# Patient Record
Sex: Male | Born: 1935 | Race: White | Hispanic: No | Marital: Married | Smoking: Former smoker
Health system: Southern US, Community
[De-identification: ages and names within clinical notes are randomized; demographics above are authoritative.]

## PROBLEM LIST (undated history)

## (undated) DIAGNOSIS — E1142 Type 2 diabetes mellitus with diabetic polyneuropathy: Secondary | ICD-10-CM

## (undated) DIAGNOSIS — M1990 Unspecified osteoarthritis, unspecified site: Secondary | ICD-10-CM

## (undated) DIAGNOSIS — C61 Malignant neoplasm of prostate: Secondary | ICD-10-CM

## (undated) DIAGNOSIS — B351 Tinea unguium: Secondary | ICD-10-CM

## (undated) DIAGNOSIS — M2040 Other hammer toe(s) (acquired), unspecified foot: Secondary | ICD-10-CM

## (undated) DIAGNOSIS — I999 Unspecified disorder of circulatory system: Secondary | ICD-10-CM

## (undated) DIAGNOSIS — E119 Type 2 diabetes mellitus without complications: Secondary | ICD-10-CM

## (undated) HISTORY — DX: Type 2 diabetes mellitus with diabetic polyneuropathy: E11.42

## (undated) HISTORY — PX: PROSTATE SURGERY: SHX751

## (undated) HISTORY — DX: Type 2 diabetes mellitus without complications: E11.9

## (undated) HISTORY — PX: OTHER SURGICAL HISTORY: SHX169

## (undated) HISTORY — PX: EYE SURGERY: SHX253

## (undated) HISTORY — DX: Malignant neoplasm of prostate: C61

## (undated) HISTORY — DX: Tinea unguium: B35.1

## (undated) HISTORY — DX: Other hammer toe(s) (acquired), unspecified foot: M20.40

## (undated) HISTORY — DX: Unspecified disorder of circulatory system: I99.9

## (undated) HISTORY — DX: Unspecified osteoarthritis, unspecified site: M19.90

---

## 2005-04-01 ENCOUNTER — Other Ambulatory Visit: Payer: Self-pay

## 2005-04-01 ENCOUNTER — Ambulatory Visit: Payer: Self-pay | Admitting: Psychiatry

## 2006-03-18 ENCOUNTER — Ambulatory Visit: Payer: Self-pay | Admitting: Unknown Physician Specialty

## 2006-10-19 ENCOUNTER — Observation Stay: Payer: Self-pay | Admitting: Gastroenterology

## 2006-12-01 ENCOUNTER — Ambulatory Visit: Payer: Self-pay | Admitting: Gastroenterology

## 2007-07-17 ENCOUNTER — Emergency Department: Payer: Self-pay | Admitting: Emergency Medicine

## 2007-07-18 ENCOUNTER — Emergency Department: Payer: Self-pay | Admitting: Unknown Physician Specialty

## 2007-07-21 ENCOUNTER — Ambulatory Visit: Payer: Self-pay | Admitting: Specialist

## 2007-08-25 ENCOUNTER — Ambulatory Visit: Payer: Self-pay | Admitting: Gastroenterology

## 2007-12-25 ENCOUNTER — Inpatient Hospital Stay: Payer: Self-pay | Admitting: Specialist

## 2007-12-25 ENCOUNTER — Other Ambulatory Visit: Payer: Self-pay

## 2008-02-29 ENCOUNTER — Ambulatory Visit: Payer: Self-pay | Admitting: Gastroenterology

## 2011-01-24 ENCOUNTER — Inpatient Hospital Stay: Payer: Self-pay | Admitting: Internal Medicine

## 2011-09-19 DIAGNOSIS — E119 Type 2 diabetes mellitus without complications: Secondary | ICD-10-CM | POA: Insufficient documentation

## 2011-11-07 DIAGNOSIS — D649 Anemia, unspecified: Secondary | ICD-10-CM | POA: Insufficient documentation

## 2012-03-07 DIAGNOSIS — H52203 Unspecified astigmatism, bilateral: Secondary | ICD-10-CM | POA: Insufficient documentation

## 2012-03-07 DIAGNOSIS — H401190 Primary open-angle glaucoma, unspecified eye, stage unspecified: Secondary | ICD-10-CM | POA: Insufficient documentation

## 2012-03-07 DIAGNOSIS — D3132 Benign neoplasm of left choroid: Secondary | ICD-10-CM | POA: Insufficient documentation

## 2012-03-07 DIAGNOSIS — Z961 Presence of intraocular lens: Secondary | ICD-10-CM | POA: Insufficient documentation

## 2012-09-14 ENCOUNTER — Encounter: Payer: Self-pay | Admitting: Specialist

## 2012-09-18 ENCOUNTER — Encounter: Payer: Self-pay | Admitting: Specialist

## 2012-10-19 ENCOUNTER — Encounter: Payer: Self-pay | Admitting: Specialist

## 2013-06-09 ENCOUNTER — Encounter: Payer: Self-pay | Admitting: Podiatry

## 2013-06-13 ENCOUNTER — Encounter: Payer: Self-pay | Admitting: Podiatry

## 2013-06-13 ENCOUNTER — Ambulatory Visit (INDEPENDENT_AMBULATORY_CARE_PROVIDER_SITE_OTHER): Payer: Medicare Other | Admitting: Podiatry

## 2013-06-13 VITALS — BP 126/64 | HR 61 | Resp 18 | Ht 67.0 in | Wt 183.0 lb

## 2013-06-13 DIAGNOSIS — M79609 Pain in unspecified limb: Secondary | ICD-10-CM

## 2013-06-13 DIAGNOSIS — B351 Tinea unguium: Secondary | ICD-10-CM

## 2013-06-13 NOTE — Progress Notes (Signed)
Jeremy Le presents today with a chief complaint of painful toenails one through 5 bilateral.  Objective: Pulses are strongly palpable bilateral nails are thick yellow dystrophic onychomycotic bilateral painful palpation.  Assessment: Pain in limb secondary to onychomycosis 1 through 5 bilateral.  Plan: Debridement of nails 1 through 5 bilateral covered service secondary to pain:

## 2013-10-10 ENCOUNTER — Ambulatory Visit (INDEPENDENT_AMBULATORY_CARE_PROVIDER_SITE_OTHER): Payer: Medicare Other | Admitting: Podiatry

## 2013-10-10 ENCOUNTER — Encounter: Payer: Self-pay | Admitting: Podiatry

## 2013-10-10 VITALS — BP 118/76 | HR 76 | Resp 18

## 2013-10-10 DIAGNOSIS — B351 Tinea unguium: Secondary | ICD-10-CM

## 2013-10-10 DIAGNOSIS — M79609 Pain in unspecified limb: Secondary | ICD-10-CM

## 2013-10-10 NOTE — Progress Notes (Signed)
He presents today with a chief complaint of bilateral foot pain due to the nails which are thick and painful with ambulation.  Objective: Vital signs are stable he is alert and oriented x3. His nails are thick yellow dystrophic onychomycotic and painful palpation.  Assessment: Pain in limb secondary to onychomycosis 1 through 5 bilateral.  Plan: Debridement of nails 1 through 5 bilateral covered service secondary to pain.

## 2014-01-20 ENCOUNTER — Emergency Department: Payer: Self-pay | Admitting: Emergency Medicine

## 2014-02-15 ENCOUNTER — Encounter: Payer: Self-pay | Admitting: Psychiatry

## 2014-02-18 ENCOUNTER — Encounter: Payer: Self-pay | Admitting: Psychiatry

## 2014-03-13 ENCOUNTER — Ambulatory Visit: Payer: Medicare Other | Admitting: Podiatry

## 2014-03-15 ENCOUNTER — Ambulatory Visit: Payer: Medicare Other | Admitting: Podiatry

## 2014-03-21 ENCOUNTER — Encounter: Payer: Self-pay | Admitting: Psychiatry

## 2014-03-22 ENCOUNTER — Ambulatory Visit (INDEPENDENT_AMBULATORY_CARE_PROVIDER_SITE_OTHER): Payer: Medicare Other | Admitting: Podiatry

## 2014-03-22 DIAGNOSIS — B351 Tinea unguium: Secondary | ICD-10-CM

## 2014-03-22 DIAGNOSIS — M79676 Pain in unspecified toe(s): Secondary | ICD-10-CM

## 2014-03-22 DIAGNOSIS — M79609 Pain in unspecified limb: Secondary | ICD-10-CM

## 2014-03-22 NOTE — Progress Notes (Signed)
He presents today for a routine nail debridement complaining of painful elongated toenails he also states that he has recently had a fall.  Objective: Pulses are palpable bilateral. Nails are thick yellow dystrophic onychomycotic and painful palpation.  Assessment: In limb secondary to onychomycosis 1 through 5 bilateral.  Plan: Debridement of nails 1 through 5 bilateral. 

## 2014-05-05 ENCOUNTER — Emergency Department: Payer: Self-pay | Admitting: Student

## 2014-06-19 ENCOUNTER — Ambulatory Visit: Payer: BC Managed Care – PPO | Admitting: Podiatry

## 2014-06-19 ENCOUNTER — Ambulatory Visit (INDEPENDENT_AMBULATORY_CARE_PROVIDER_SITE_OTHER): Payer: Medicare Other | Admitting: Podiatry

## 2014-06-19 DIAGNOSIS — B351 Tinea unguium: Secondary | ICD-10-CM

## 2014-06-19 DIAGNOSIS — M79676 Pain in unspecified toe(s): Secondary | ICD-10-CM

## 2014-06-19 NOTE — Progress Notes (Signed)
He presents today for a routine nail debridement complaining of painful elongated toenails he also states that he has recently had a fall.  Objective: Pulses are palpable bilateral. Nails are thick yellow dystrophic onychomycotic and painful palpation.  Assessment: In limb secondary to onychomycosis 1 through 5 bilateral.  Plan: Debridement of nails 1 through 5 bilateral. 

## 2014-07-18 ENCOUNTER — Encounter: Payer: Self-pay | Admitting: General Surgery

## 2014-07-18 ENCOUNTER — Ambulatory Visit: Payer: Self-pay

## 2014-07-21 ENCOUNTER — Encounter: Payer: Self-pay | Admitting: General Surgery

## 2014-08-08 ENCOUNTER — Encounter: Payer: Self-pay | Admitting: Surgery

## 2014-09-18 ENCOUNTER — Ambulatory Visit (INDEPENDENT_AMBULATORY_CARE_PROVIDER_SITE_OTHER): Payer: Medicare Other | Admitting: Podiatry

## 2014-09-18 DIAGNOSIS — B351 Tinea unguium: Secondary | ICD-10-CM

## 2014-09-18 DIAGNOSIS — M79676 Pain in unspecified toe(s): Secondary | ICD-10-CM

## 2014-09-18 NOTE — Progress Notes (Signed)
He presents today for a routine nail debridement complaining of painful elongated toenails he also states that he has recently had a fall.  Objective: Pulses are palpable bilateral. Nails are thick yellow dystrophic onychomycotic and painful palpation.  Assessment: In limb secondary to onychomycosis 1 through 5 bilateral.  Plan: Debridement of nails 1 through 5 bilateral.

## 2014-09-29 ENCOUNTER — Ambulatory Visit (INDEPENDENT_AMBULATORY_CARE_PROVIDER_SITE_OTHER): Payer: Medicare Other | Admitting: Podiatry

## 2014-09-29 VITALS — BP 108/49 | HR 67 | Resp 16

## 2014-09-29 DIAGNOSIS — L03031 Cellulitis of right toe: Secondary | ICD-10-CM | POA: Diagnosis not present

## 2014-09-29 DIAGNOSIS — L03012 Cellulitis of left finger: Secondary | ICD-10-CM

## 2014-09-29 NOTE — Progress Notes (Signed)
Subjective:     Patient ID: Jeremy Le., male   DOB: [birth year], 81 y.o.   MRN: [id number]  HPI patient presents stating that the big toe on the left has a small abrasion and they were concerned. He is with his daughter   Review of Systems     Objective:   Physical Exam Neurovascular status is adequate with a small abrasion on the distal portion of the left hallux that's localized with no proximal edema erythema or drainage noted    Assessment:     Small distal abrasion left that's localized in nature    Plan:     Applied Silvadene and sterile dressing and gave instructions on wider-type shoe gear and if any issues should occur to let us know immediately

## 2014-11-27 ENCOUNTER — Ambulatory Visit (INDEPENDENT_AMBULATORY_CARE_PROVIDER_SITE_OTHER): Payer: Medicare Other | Admitting: Podiatry

## 2014-11-27 DIAGNOSIS — M79676 Pain in unspecified toe(s): Secondary | ICD-10-CM

## 2014-11-27 DIAGNOSIS — B351 Tinea unguium: Secondary | ICD-10-CM | POA: Diagnosis not present

## 2014-11-27 NOTE — Progress Notes (Signed)
He presents today for a routine nail debridement complaining of painful elongated toenails he also states that he has recently had a fall.  Objective: Pulses are palpable bilateral. Nails are thick yellow dystrophic onychomycotic and painful palpation.  Assessment: In limb secondary to onychomycosis 1 through 5 bilateral.  Plan: Debridement of nails 1 through 5 bilateral.

## 2014-12-13 DIAGNOSIS — K219 Gastro-esophageal reflux disease without esophagitis: Secondary | ICD-10-CM | POA: Insufficient documentation

## 2015-03-05 ENCOUNTER — Ambulatory Visit: Payer: Medicare Other

## 2015-03-06 ENCOUNTER — Ambulatory Visit (INDEPENDENT_AMBULATORY_CARE_PROVIDER_SITE_OTHER): Payer: Medicare Other | Admitting: Podiatry

## 2015-03-06 DIAGNOSIS — B351 Tinea unguium: Secondary | ICD-10-CM | POA: Diagnosis not present

## 2015-03-06 DIAGNOSIS — M79676 Pain in unspecified toe(s): Secondary | ICD-10-CM | POA: Diagnosis not present

## 2015-03-06 NOTE — Progress Notes (Signed)
Subjective: 81 y.o. returns the office today for painful, elongated, thickened toenails which he is unable to trim himself. Denies any redness or drainage around the nails. Denies any acute changes since last appointment and no new complaints today. Denies any systemic complaints such as fevers, chills, nausea, vomiting.   Objective: AAO 3, NAD DP/PT pulses palpable, CRT less than 3 seconds Nails hypertrophic, dystrophic, elongated, brittle, discolored 10. There is tenderness overlying the nails 1-5 bilaterally. There is no surrounding erythema or drainage along the nail sites. No open lesions or pre-ulcerative lesions are identified. No other areas of tenderness bilateral lower extremities. No overlying edema, erythema, increased warmth. No pain with calf compression, swelling, warmth, erythema.  Assessment: Patient presents with symptomatic onychomycosis  Plan: -Treatment options including alternatives, risks, complications were discussed -Nails sharply debrided 10 without complication/bleeding. -Discussed daily foot inspection. If there are any changes, to call the office immediately.  -Follow-up in 3 months or sooner if any problems are to arise. In the meantime, encouraged to call the office with any questions, concerns, changes symptoms.   Celesta Gentile, DPM

## 2015-03-16 DIAGNOSIS — Z8659 Personal history of other mental and behavioral disorders: Secondary | ICD-10-CM | POA: Insufficient documentation

## 2015-05-24 ENCOUNTER — Ambulatory Visit (INDEPENDENT_AMBULATORY_CARE_PROVIDER_SITE_OTHER): Payer: Medicare Other | Admitting: Podiatry

## 2015-05-24 DIAGNOSIS — M79676 Pain in unspecified toe(s): Secondary | ICD-10-CM

## 2015-05-24 DIAGNOSIS — B351 Tinea unguium: Secondary | ICD-10-CM | POA: Diagnosis not present

## 2015-05-24 NOTE — Progress Notes (Signed)
Subjective: 81 y.o. returns the office today for painful, elongated, thickened toenails which he is unable to trim himself. Denies any redness or drainage around the nails. Denies any acute changes since last appointment and no new complaints today. Denies any systemic complaints such as fevers, chills, nausea, vomiting.   Objective: AAO 3, NAD DP/PT pulses palpable, CRT less than 3 seconds Nails hypertrophic, dystrophic, elongated, brittle, discolored 10. There is tenderness overlying the nails 1-5 bilaterally. There is no surrounding erythema or drainage along the nail sites. No open lesions or pre-ulcerative lesions are identified. No other areas of tenderness bilateral lower extremities. No overlying edema, erythema, increased warmth. No pain with calf compression, swelling, warmth, erythema.  Assessment: Patient presents with symptomatic onychomycosis  Plan: -Treatment options including alternatives, risks, complications were discussed -Nails sharply debrided 10 without complication/bleeding. -Discussed daily foot inspection. If there are any changes, to call the office immediately.  -Follow-up in 3 months or sooner if any problems are to arise. In the meantime, encouraged to call the office with any questions, concerns, changes symptoms.   Eavan Gonterman, DPM  

## 2015-07-26 ENCOUNTER — Ambulatory Visit: Payer: Medicare Other

## 2015-07-26 ENCOUNTER — Ambulatory Visit (INDEPENDENT_AMBULATORY_CARE_PROVIDER_SITE_OTHER): Payer: Medicare Other | Admitting: Podiatry

## 2015-07-26 ENCOUNTER — Encounter: Payer: Self-pay | Admitting: Podiatry

## 2015-07-26 DIAGNOSIS — M79676 Pain in unspecified toe(s): Secondary | ICD-10-CM

## 2015-07-26 DIAGNOSIS — B351 Tinea unguium: Secondary | ICD-10-CM

## 2015-07-26 NOTE — Progress Notes (Signed)
Subjective: 82 y.o. returns the office today for painful, elongated, thickened toenails which he is unable to trim himself. Denies any redness or drainage around the nails. Denies any acute changes since last appointment and no new complaints today. Denies any systemic complaints such as fevers, chills, nausea, vomiting.   Objective: AAO 3, NAD DP/PT pulses palpable, CRT less than 3 seconds Nails hypertrophic, dystrophic, elongated, brittle, discolored 10. There is tenderness overlying the nails 1-5 bilaterally. There is no surrounding erythema or drainage along the nail sites. No open lesions or pre-ulcerative lesions are identified. No other areas of tenderness bilateral lower extremities. No overlying edema, erythema, increased warmth. No pain with calf compression, swelling, warmth, erythema.  Assessment: Patient presents with symptomatic onychomycosis  Plan: -Treatment options including alternatives, risks, complications were discussed -Nails sharply debrided 10 without complication/bleeding. -Discussed daily foot inspection. If there are any changes, to call the office immediately.  -Follow-up in 3 months or sooner if any problems are to arise. In the meantime, encouraged to call the office with any questions, concerns, changes symptoms.   Airelle Everding, DPM  

## 2015-10-25 ENCOUNTER — Encounter: Payer: Self-pay | Admitting: Podiatry

## 2015-10-25 ENCOUNTER — Ambulatory Visit (INDEPENDENT_AMBULATORY_CARE_PROVIDER_SITE_OTHER): Payer: Medicare Other | Admitting: Podiatry

## 2015-10-25 DIAGNOSIS — M79676 Pain in unspecified toe(s): Secondary | ICD-10-CM

## 2015-10-25 DIAGNOSIS — B351 Tinea unguium: Secondary | ICD-10-CM | POA: Diagnosis not present

## 2015-10-25 NOTE — Progress Notes (Signed)
Patient ID: Jeremy Le., male   DOB: 1922/04/18, 82 y.o.   MRN: [id number]  Subjective: 82 y.o. returns the office today for painful, elongated, thickened toenails which he is unable to trim himself. Denies any redness or drainage around the nails. Denies any acute changes since last appointment and no new complaints today. Denies any systemic complaints such as fevers, chills, nausea, vomiting.   Objective: AAO 3, NAD DP/PT pulses palpable, CRT less than 3 seconds Nails hypertrophic, dystrophic, elongated, brittle, discolored 10. There is tenderness overlying the nails 1-5 bilaterally. There is no surrounding erythema or drainage along the nail sites. There is an area on the anterior distal leg that is white, scaly skin. No surrounding erythema, drainage, pain.  No open lesions or pre-ulcerative lesions are identified. No other areas of tenderness bilateral lower extremities. No overlying edema, erythema, increased warmth. No pain with calf compression, swelling, warmth, erythema.  Assessment: Patient presents with symptomatic onychomycosis  Plan: -Treatment options including alternatives, risks, complications were discussed -Nails sharply debrided 10 without complication/bleeding. -Recommend biopsy for the lesion on the left distal leg. The patients daughter states he has an upcoming appointment with dermatology. If he does not get in to see them, they will call the office for me to biopsy.  -Discussed daily foot inspection. If there are any changes, to call the office immediately.  -Follow-up in 3 months or sooner if any problems are to arise. In the meantime, encouraged to call the office with any questions, concerns, changes symptoms.  Celesta Gentile, DPM

## 2016-01-31 ENCOUNTER — Ambulatory Visit: Payer: Medicare Other | Admitting: Podiatry

## 2016-02-05 ENCOUNTER — Ambulatory Visit: Payer: Medicare Other | Admitting: Podiatry

## 2016-02-07 ENCOUNTER — Encounter: Payer: Self-pay | Admitting: Podiatry

## 2016-02-07 ENCOUNTER — Ambulatory Visit (INDEPENDENT_AMBULATORY_CARE_PROVIDER_SITE_OTHER): Payer: Medicare Other | Admitting: Podiatry

## 2016-02-07 DIAGNOSIS — M79676 Pain in unspecified toe(s): Secondary | ICD-10-CM

## 2016-02-07 DIAGNOSIS — B351 Tinea unguium: Secondary | ICD-10-CM

## 2016-02-07 NOTE — Progress Notes (Signed)
Patient ID: Jeremy Le., male   DOB: [birth year], 82 y.o.   MRN: [id number]  Subjective: 82 y.o. returns the office today for painful, elongated, thickened toenails which he is unable to trim himself. Denies any redness or drainage around the nails. Denies any acute changes since last appointment and no new complaints today. Denies any systemic complaints such as fevers, chills, nausea, vomiting.   Objective: AAO 3, NAD DP/PT pulses palpable, CRT less than 3 seconds Nails hypertrophic, dystrophic, elongated, brittle, discolored 10. There is tenderness overlying the nails 1-5 bilaterally. There is no surrounding erythema or drainage along the nail sites. No open lesions or pre-ulcerative lesions are identified. No other areas of tenderness bilateral lower extremities. No overlying edema, erythema, increased warmth. No pain with calf compression, swelling, warmth, erythema.  Assessment: Patient presents with symptomatic onychomycosis  Plan: -Treatment options including alternatives, risks, complications were discussed -Nails sharply debrided 10 without complication/bleeding. -Discussed daily foot inspection. If there are any changes, to call the office immediately.  -Follow-up in 3 months or sooner if any problems are to arise. In the meantime, encouraged to call the office with any questions, concerns, changes symptoms.  Celesta Gentile, DPM

## 2016-03-31 ENCOUNTER — Ambulatory Visit (INDEPENDENT_AMBULATORY_CARE_PROVIDER_SITE_OTHER): Payer: Medicare Other | Admitting: Podiatry

## 2016-03-31 DIAGNOSIS — M79661 Pain in right lower leg: Secondary | ICD-10-CM | POA: Diagnosis not present

## 2016-03-31 DIAGNOSIS — L03031 Cellulitis of right toe: Secondary | ICD-10-CM

## 2016-03-31 DIAGNOSIS — L600 Ingrowing nail: Secondary | ICD-10-CM | POA: Diagnosis not present

## 2016-03-31 DIAGNOSIS — M79674 Pain in right toe(s): Secondary | ICD-10-CM

## 2016-03-31 DIAGNOSIS — M7989 Other specified soft tissue disorders: Secondary | ICD-10-CM

## 2016-03-31 DIAGNOSIS — L03011 Cellulitis of right finger: Secondary | ICD-10-CM

## 2016-03-31 NOTE — Patient Instructions (Signed)

## 2016-03-31 NOTE — Progress Notes (Signed)
Subjective: Patient presents with his daughter today for emergency appointment to pain in the right great toe. Patient states that he has an ingrown nail with significantly painful and is unable to walk or ambulate. Patient lives closer to [facility] however due the emergency in the emergency appointment they drove here to [facility] to be treated and evaluated.  No Known Allergies  Objective:  General: Well developed, nourished, in no acute distress, alert and oriented x3   Dermatology: Skin is warm, dry and supple bilateral. Right hallux nail appears to be  severely incurvated with hyperkeratosis formation at the distal aspects of  the medial and lateral nail border. The remaining nails appear unremarkable at this time. There are no open sores, lesions.  Vascular: Dorsalis Pedis artery and Posterior Tibial artery pedal pulses palpable. No lower extremity edema noted.   Neruologic: Grossly intact via light touch bilateral.  Musculoskeletal: Tenderness to palpation of the right hallux lateral nail fold(s). Muscular strength within normal limits in all groups bilateral.   Assesement: #1 paronychia right great toe lateral border #2 onychocryptosis right great toe lateral border next line  #3 pain and swelling of the right great toe. #4 diabetes mellitus   Plan of Care:  1. Patient evaluated.  2. Discussed treatment alternatives and plan of care; Explained nail avulsion procedure and post procedure course to patient. 3. Patient opted for partial nail avulsion.  4. Prior to procedure, local anesthesia infiltration utilized using 3 ml of a 50:50 mixture of 2% plain lidocaine and 0.5% plain marcaine in a normal hallux block fashion and a betadine prep performed.  5. Partial permanent nail avulsion performed including the respective nail matrix using [id number] applications of phenol followed by alcohol flush on the.  6. Light dressing applied. 7. Return to clinic in 3 weeks in [place].  Patient artery has an appointment with Dr. Earleen Newport to be seen in [place] on October 5 approximately. Maintaining that appointment and checking out the same time as the routine nail care.   Edrick Kins, DPM

## 2016-04-21 DIAGNOSIS — H353290 Exudative age-related macular degeneration, unspecified eye, stage unspecified: Secondary | ICD-10-CM | POA: Insufficient documentation

## 2016-04-24 ENCOUNTER — Encounter: Payer: Self-pay | Admitting: Podiatry

## 2016-04-24 ENCOUNTER — Ambulatory Visit (INDEPENDENT_AMBULATORY_CARE_PROVIDER_SITE_OTHER): Payer: Medicare Other | Admitting: Podiatry

## 2016-04-24 DIAGNOSIS — M79675 Pain in left toe(s): Secondary | ICD-10-CM | POA: Diagnosis not present

## 2016-04-24 DIAGNOSIS — M79674 Pain in right toe(s): Secondary | ICD-10-CM | POA: Diagnosis not present

## 2016-04-24 DIAGNOSIS — T148XXA Other injury of unspecified body region, initial encounter: Secondary | ICD-10-CM | POA: Diagnosis not present

## 2016-04-24 DIAGNOSIS — M7989 Other specified soft tissue disorders: Secondary | ICD-10-CM

## 2016-04-24 DIAGNOSIS — B351 Tinea unguium: Secondary | ICD-10-CM | POA: Diagnosis not present

## 2016-05-01 NOTE — Progress Notes (Signed)
Patient ID: Jeremy Cornfield., male   DOB: 1921-10-08, 82 y.o.   MRN: [id number]  Subjective: 82 y.o. returns the office today for painful, elongated, thickened toenails which he is unable to trim himself. Denies any redness or drainage around the nails. The procedure site and a previous partial nail avulsion has been healing well and he denies any drainage or redness or any swelling. Noted pus. Denies any acute changes since last appointment and no new complaints today. Denies any systemic complaints such as fevers, chills, nausea, vomiting.   Objective: NAD; presents with daughter.  DP/PT pulses palpable, CRT less than 3 seconds Nails hypertrophic, dystrophic, elongated, brittle, discolored 10. There is tenderness overlying the nails 1-5 bilaterally. There is no surrounding erythema or drainage along the nail sites. The procedure site appears to be healed. On the posterior aspect of the right heels a small superficial abrasion. There is no surrounding erythema or ascending synovitis. There is no fluctuance or crepitus or any malodor. No open lesions or pre-ulcerative lesions are identified. No other areas of tenderness bilateral lower extremities. No overlying edema, erythema, increased warmth. No pain with calf compression, swelling, warmth, erythema.  Assessment: Patient presents with symptomatic onychomycosis; right posterior heel abrasion  Plan: -Treatment options including alternatives, risks, complications were discussed -Nails sharply debrided 10 without complication/bleeding. -Continue in about ointment to the right posterior heel monitor for infection. Call the office is not healed within 2 weeks or sooner if needed. If any signs or symptoms of infection to the ER. -Discussed daily foot inspection. If there are any changes, to call the office immediately.  -Follow-up in 3 months or sooner if any problems are to arise. In the meantime, encouraged to call the office with any questions,  concerns, changes symptoms.  Celesta Gentile, DPM

## 2016-06-26 ENCOUNTER — Encounter: Payer: Self-pay | Admitting: Podiatry

## 2016-06-26 ENCOUNTER — Ambulatory Visit: Payer: Medicare Other | Admitting: Podiatry

## 2016-06-26 ENCOUNTER — Ambulatory Visit (INDEPENDENT_AMBULATORY_CARE_PROVIDER_SITE_OTHER): Payer: Medicare Other | Admitting: Podiatry

## 2016-06-26 DIAGNOSIS — L608 Other nail disorders: Secondary | ICD-10-CM

## 2016-06-26 DIAGNOSIS — L603 Nail dystrophy: Secondary | ICD-10-CM

## 2016-06-26 DIAGNOSIS — M79609 Pain in unspecified limb: Secondary | ICD-10-CM | POA: Diagnosis not present

## 2016-06-26 DIAGNOSIS — B351 Tinea unguium: Secondary | ICD-10-CM | POA: Diagnosis not present

## 2016-06-26 NOTE — Progress Notes (Signed)

## 2016-09-01 ENCOUNTER — Encounter: Payer: Self-pay | Admitting: Podiatry

## 2016-09-01 ENCOUNTER — Ambulatory Visit (INDEPENDENT_AMBULATORY_CARE_PROVIDER_SITE_OTHER): Payer: Medicare Other | Admitting: Podiatry

## 2016-09-01 DIAGNOSIS — B351 Tinea unguium: Secondary | ICD-10-CM | POA: Diagnosis not present

## 2016-09-01 DIAGNOSIS — M79609 Pain in unspecified limb: Secondary | ICD-10-CM | POA: Diagnosis not present

## 2016-09-01 NOTE — Progress Notes (Signed)
Complaint:  Visit Type: Patient returns to my office for continued preventative foot care services. Complaint: Patient states" my nails have grown long and thick and become painful to walk and wear shoes" Patient has been diagnosed with DM but has stopped taking his medicine since he has lost weight and his sugars were going low.. The patient presents for preventative foot care services. No changes to ROS  Podiatric Exam: Vascular: dorsalis pedis and posterior tibial pulses are barely  palpable bilateral. Capillary return is immediate. Temperature gradient is WNL. Skin turgor WNL  Sensorium: Normal Semmes Weinstein monofilament test. Normal tactile sensation bilaterally. Nail Exam: Pt has thick disfigured discolored nails with subungual debris noted bilateral entire nail hallux through fifth toenails Ulcer Exam: There is no evidence of ulcer or pre-ulcerative changes or infection. Orthopedic Exam: Muscle tone and strength are WNL. No limitations in general ROM. No crepitus or effusions noted. Foot type and digits show no abnormalities. Bony prominences are unremarkable. Skin: No Porokeratosis. No infection or ulcers  Diagnosis:  Onychomycosis, , Pain in right toe, pain in left toes  Treatment & Plan Procedures and Treatment: Consent by patient was obtained for treatment procedures. The patient understood the discussion of treatment and procedures well. All questions were answered thoroughly reviewed. Debridement of mycotic and hypertrophic toenails, 1 through 5 bilateral and clearing of subungual debris. No ulceration, no infection noted.  Return Visit-Office Procedure: Patient instructed to return to the office for a follow up visit 3 months for continued evaluation and treatment.    Gardiner Barefoot DPM

## 2016-09-04 ENCOUNTER — Ambulatory Visit: Payer: Medicare Other | Admitting: Podiatry

## 2016-11-17 ENCOUNTER — Encounter: Payer: Self-pay | Admitting: Podiatry

## 2016-11-17 ENCOUNTER — Ambulatory Visit (INDEPENDENT_AMBULATORY_CARE_PROVIDER_SITE_OTHER): Payer: Medicare Other | Admitting: Podiatry

## 2016-11-17 DIAGNOSIS — M79609 Pain in unspecified limb: Secondary | ICD-10-CM

## 2016-11-17 DIAGNOSIS — E1159 Type 2 diabetes mellitus with other circulatory complications: Secondary | ICD-10-CM

## 2016-11-17 DIAGNOSIS — B351 Tinea unguium: Secondary | ICD-10-CM

## 2016-11-17 NOTE — Progress Notes (Signed)
Complaint:  Visit Type: Patient returns to my office for continued preventative foot care services. Complaint: Patient states" my nails have grown long and thick and become painful to walk and wear shoes" Patient has been diagnosed with DM . The patient presents for preventative foot care services. No changes to ROS  Podiatric Exam: Vascular: dorsalis pedis and posterior tibial pulses are barely  palpable bilateral. Capillary return is immediate.Cold feet  B/L. Skin turgor WNL  Sensorium: Normal Semmes Weinstein monofilament test. Normal tactile sensation bilaterally. Nail Exam: Pt has thick disfigured discolored nails with subungual debris noted bilateral entire nail hallux through fifth toenails Ulcer Exam: There is no evidence of ulcer or pre-ulcerative changes or infection. Orthopedic Exam: Muscle tone and strength are WNL. No limitations in general ROM. No crepitus or effusions noted. Foot type and digits show no abnormalities. Bony prominences are unremarkable. Skin: No Porokeratosis. No infection or ulcers  Diagnosis:  Onychomycosis, , Pain in right toe, pain in left toes  Treatment & Plan Procedures and Treatment: Consent by patient was obtained for treatment procedures. The patient understood the discussion of treatment and procedures well. All questions were answered thoroughly reviewed. Debridement of mycotic and hypertrophic toenails, 1 through 5 bilateral and clearing of subungual debris. No ulceration, no infection noted.  Return Visit-Office Procedure: Patient instructed to return to the office for a follow up visit 3 months for continued evaluation and treatment.    Gardiner Barefoot DPM

## 2017-02-19 ENCOUNTER — Encounter: Payer: Self-pay | Admitting: Podiatry

## 2017-02-19 ENCOUNTER — Ambulatory Visit (INDEPENDENT_AMBULATORY_CARE_PROVIDER_SITE_OTHER): Payer: Medicare Other | Admitting: Podiatry

## 2017-02-19 DIAGNOSIS — M79609 Pain in unspecified limb: Secondary | ICD-10-CM | POA: Diagnosis not present

## 2017-02-19 DIAGNOSIS — E1159 Type 2 diabetes mellitus with other circulatory complications: Secondary | ICD-10-CM

## 2017-02-19 DIAGNOSIS — B351 Tinea unguium: Secondary | ICD-10-CM

## 2017-02-19 NOTE — Progress Notes (Signed)
Complaint:  Visit Type: Patient returns to my office for continued preventative foot care services. Complaint: Patient states" my nails have grown long and thick and become painful to walk and wear shoes" Patient has been diagnosed with DM . The patient presents for preventative foot care services. No changes to ROS  Podiatric Exam: Vascular: dorsalis pedis and posterior tibial pulses are barely  palpable bilateral. Capillary return is immediate.Cold feet  B/L. Skin turgor WNL  Sensorium: Normal Semmes Weinstein monofilament test. Normal tactile sensation bilaterally. Nail Exam: Pt has thick disfigured discolored nails with subungual debris noted bilateral entire nail hallux through fifth toenails Ulcer Exam: There is no evidence of ulcer or pre-ulcerative changes or infection. Orthopedic Exam: Muscle tone and strength are WNL. No limitations in general ROM. No crepitus or effusions noted. Foot type and digits show no abnormalities. Bony prominences are unremarkable. Skin: No Porokeratosis. No infection or ulcers  Diagnosis:  Onychomycosis, , Pain in right toe, pain in left toes  Treatment & Plan Procedures and Treatment: Consent by patient was obtained for treatment procedures. The patient understood the discussion of treatment and procedures well. All questions were answered thoroughly reviewed. Debridement of mycotic and hypertrophic toenails, 1 through 5 bilateral and clearing of subungual debris. No ulceration, no infection noted.  Return Visit-Office Procedure: Patient instructed to return to the office for a follow up visit 3 months for continued evaluation and treatment.    Gardiner Barefoot DPM

## 2017-03-31 DIAGNOSIS — F3170 Bipolar disorder, currently in remission, most recent episode unspecified: Secondary | ICD-10-CM | POA: Insufficient documentation

## 2017-04-23 ENCOUNTER — Ambulatory Visit (INDEPENDENT_AMBULATORY_CARE_PROVIDER_SITE_OTHER): Payer: Medicare Other | Admitting: Podiatry

## 2017-04-23 ENCOUNTER — Encounter: Payer: Self-pay | Admitting: Podiatry

## 2017-04-23 DIAGNOSIS — M79609 Pain in unspecified limb: Secondary | ICD-10-CM | POA: Diagnosis not present

## 2017-04-23 DIAGNOSIS — B351 Tinea unguium: Secondary | ICD-10-CM

## 2017-04-23 DIAGNOSIS — E1159 Type 2 diabetes mellitus with other circulatory complications: Secondary | ICD-10-CM

## 2017-04-23 NOTE — Progress Notes (Signed)
Complaint:  Visit Type: Patient returns to my office for continued preventative foot care services. Complaint: Patient states" my nails have grown long and thick and become painful to walk and wear shoes" Patient has been diagnosed with DM . The patient presents for preventative foot care services. No changes to ROS  Podiatric Exam: Vascular: dorsalis pedis and posterior tibial pulses are barely  palpable bilateral. Capillary return is immediate.Cold feet  B/L. Skin turgor WNL  Sensorium: Normal Semmes Weinstein monofilament test. Normal tactile sensation bilaterally. Nail Exam: Pt has thick disfigured discolored nails with subungual debris noted bilateral entire nail hallux through fifth toenails Ulcer Exam: There is no evidence of ulcer or pre-ulcerative changes or infection. Orthopedic Exam: Muscle tone and strength are WNL. No limitations in general ROM. No crepitus or effusions noted. Foot type and digits show no abnormalities. HAV  B/L. Skin: No Porokeratosis. No infection or ulcers  Diagnosis:  Onychomycosis, , Pain in right toe, pain in left toes  Treatment & Plan Procedures and Treatment: Consent by patient was obtained for treatment procedures. The patient understood the discussion of treatment and procedures well. All questions were answered thoroughly reviewed. Debridement of mycotic and hypertrophic toenails, 1 through 5 bilateral and clearing of subungual debris. No ulceration, no infection noted.  Signed  ABN for 2018. Return Visit-Office Procedure: Patient instructed to return to the office for a follow up visit 3 months for continued evaluation and treatment.    Gardiner Barefoot DPM

## 2017-07-06 ENCOUNTER — Encounter: Payer: Self-pay | Admitting: Podiatry

## 2017-07-06 ENCOUNTER — Ambulatory Visit (INDEPENDENT_AMBULATORY_CARE_PROVIDER_SITE_OTHER): Payer: Medicare Other | Admitting: Podiatry

## 2017-07-06 DIAGNOSIS — E1159 Type 2 diabetes mellitus with other circulatory complications: Secondary | ICD-10-CM

## 2017-07-06 DIAGNOSIS — B351 Tinea unguium: Secondary | ICD-10-CM

## 2017-07-06 DIAGNOSIS — M79676 Pain in unspecified toe(s): Secondary | ICD-10-CM

## 2017-07-06 DIAGNOSIS — M79609 Pain in unspecified limb: Principal | ICD-10-CM

## 2017-07-06 NOTE — Progress Notes (Signed)
Complaint:  Visit Type: Patient returns to my office for continued preventative foot care services. Complaint: Patient states" my nails have grown long and thick and become painful to walk and wear shoes" Patient has been diagnosed with DM . The patient presents for preventative foot care services. No changes to ROS  Podiatric Exam: Vascular: dorsalis pedis and posterior tibial pulses are barely  palpable bilateral. Capillary return is immediate.Cold feet  B/L. Skin turgor WNL  Sensorium: Normal Semmes Weinstein monofilament test. Normal tactile sensation bilaterally. Nail Exam: Pt has thick disfigured discolored nails with subungual debris noted bilateral entire nail hallux through fifth toenails Ulcer Exam: There is no evidence of ulcer or pre-ulcerative changes or infection. Orthopedic Exam: Muscle tone and strength are WNL. No limitations in general ROM. No crepitus or effusions noted. Foot type and digits show no abnormalities. HAV  B/L. Skin: No Porokeratosis. No infection or ulcers  Diagnosis:  Onychomycosis, , Pain in right toe, pain in left toes  Treatment & Plan Procedures and Treatment: Consent by patient was obtained for treatment procedures. The patient understood the discussion of treatment and procedures well. All questions were answered thoroughly reviewed. Debridement of mycotic and hypertrophic toenails, 1 through 5 bilateral and clearing of subungual debris. No ulceration, no infection noted.  Signed  ABN for 2018. Return Visit-Office Procedure: Patient instructed to return to the office for a follow up visit 4 months for continued evaluation and treatment.    Gardiner Barefoot DPM

## 2017-07-27 ENCOUNTER — Ambulatory Visit: Payer: Medicare Other | Admitting: Podiatry

## 2017-11-05 ENCOUNTER — Encounter: Payer: Self-pay | Admitting: Podiatry

## 2017-11-05 ENCOUNTER — Ambulatory Visit (INDEPENDENT_AMBULATORY_CARE_PROVIDER_SITE_OTHER): Payer: Medicare Other | Admitting: Podiatry

## 2017-11-05 DIAGNOSIS — M79676 Pain in unspecified toe(s): Secondary | ICD-10-CM | POA: Diagnosis not present

## 2017-11-05 DIAGNOSIS — B351 Tinea unguium: Secondary | ICD-10-CM

## 2017-11-05 DIAGNOSIS — M79609 Pain in unspecified limb: Principal | ICD-10-CM

## 2017-11-05 DIAGNOSIS — E1159 Type 2 diabetes mellitus with other circulatory complications: Secondary | ICD-10-CM

## 2017-11-05 NOTE — Progress Notes (Signed)
Complaint:  Visit Type: Patient returns to my office for continued preventative foot care services. Complaint: Patient states" my nails have grown long and thick and become painful to walk and wear shoes" Patient has been diagnosed with DM . The patient presents for preventative foot care services. No changes to ROS  Podiatric Exam: Vascular: dorsalis pedis and posterior tibial pulses are barely  palpable bilateral. Capillary return is immediate.Cold feet  B/L. Skin turgor WNL  Sensorium: Normal Semmes Weinstein monofilament test. Normal tactile sensation bilaterally. Nail Exam: Pt has thick disfigured discolored nails with subungual debris noted bilateral entire nail hallux through fifth toenails Ulcer Exam: There is no evidence of ulcer or pre-ulcerative changes or infection. Orthopedic Exam: Muscle tone and strength are WNL. No limitations in general ROM. No crepitus or effusions noted. Foot type and digits show no abnormalities. HAV  B/L. Skin: No Porokeratosis. No infection or ulcers  Diagnosis:  Onychomycosis, , Pain in right toe, pain in left toes  Treatment & Plan Procedures and Treatment: Consent by patient was obtained for treatment procedures. The patient understood the discussion of treatment and procedures well. All questions were answered thoroughly reviewed. Debridement of mycotic and hypertrophic toenails, 1 through 5 bilateral and clearing of subungual debris. No ulceration, no infection noted.  Signed  ABN for 2019. Return Visit-Office Procedure: Patient instructed to return to the office for a follow up visit 4 months for continued evaluation and treatment.    Lareen Mullings DPM 

## 2018-03-11 ENCOUNTER — Ambulatory Visit (INDEPENDENT_AMBULATORY_CARE_PROVIDER_SITE_OTHER): Payer: Medicare Other | Admitting: Podiatry

## 2018-03-11 ENCOUNTER — Ambulatory Visit: Payer: Medicare Other | Admitting: Podiatry

## 2018-03-11 ENCOUNTER — Encounter: Payer: Self-pay | Admitting: Podiatry

## 2018-03-11 DIAGNOSIS — M79609 Pain in unspecified limb: Secondary | ICD-10-CM | POA: Diagnosis not present

## 2018-03-11 DIAGNOSIS — E1159 Type 2 diabetes mellitus with other circulatory complications: Secondary | ICD-10-CM

## 2018-03-11 DIAGNOSIS — B351 Tinea unguium: Secondary | ICD-10-CM | POA: Diagnosis not present

## 2018-03-11 DIAGNOSIS — E1142 Type 2 diabetes mellitus with diabetic polyneuropathy: Secondary | ICD-10-CM

## 2018-03-11 NOTE — Progress Notes (Signed)
Complaint:  Visit Type: Patient returns to my office for continued preventative foot care services. Complaint: Patient states" my nails have grown long and thick and become painful to walk and wear shoes" Patient has been diagnosed with DM . The patient presents for preventative foot care services. No changes to ROS  Podiatric Exam: Vascular: dorsalis pedis and posterior tibial pulses are barely  palpable bilateral. Capillary return is immediate.Cold feet  B/L. Skin turgor WNL  Sensorium: Normal Semmes Weinstein monofilament test. Normal tactile sensation bilaterally. Nail Exam: Pt has thick disfigured discolored nails with subungual debris noted bilateral entire nail hallux through fifth toenails Ulcer Exam: There is no evidence of ulcer or pre-ulcerative changes or infection. Orthopedic Exam: Muscle tone and strength are WNL. No limitations in general ROM. No crepitus or effusions noted. Foot type and digits show no abnormalities. HAV  B/L. Skin: No Porokeratosis. No infection or ulcers  Diagnosis:  Onychomycosis, , Pain in right toe, pain in left toes  Treatment & Plan Procedures and Treatment: Consent by patient was obtained for treatment procedures. The patient understood the discussion of treatment and procedures well. All questions were answered thoroughly reviewed. Debridement of mycotic and hypertrophic toenails, 1 through 5 bilateral and clearing of subungual debris. No ulceration, no infection noted.  Signed  ABN for 2019. Return Visit-Office Procedure: Patient instructed to return to the office for a follow up visit 4 months for continued evaluation and treatment.    Gardiner Barefoot DPM

## 2018-05-13 ENCOUNTER — Ambulatory Visit (INDEPENDENT_AMBULATORY_CARE_PROVIDER_SITE_OTHER): Payer: Medicare Other | Admitting: Podiatry

## 2018-05-13 ENCOUNTER — Encounter: Payer: Self-pay | Admitting: Podiatry

## 2018-05-13 DIAGNOSIS — M79609 Pain in unspecified limb: Principal | ICD-10-CM

## 2018-05-13 DIAGNOSIS — E1159 Type 2 diabetes mellitus with other circulatory complications: Secondary | ICD-10-CM | POA: Diagnosis not present

## 2018-05-13 DIAGNOSIS — B351 Tinea unguium: Secondary | ICD-10-CM

## 2018-05-13 DIAGNOSIS — M79676 Pain in unspecified toe(s): Secondary | ICD-10-CM

## 2018-05-13 NOTE — Progress Notes (Signed)
Complaint:  Visit Type: Patient returns to my office for continued preventative foot care services. Complaint: Patient states" my nails have grown long and thick and become painful to walk and wear shoes" Patient has been diagnosed with DM . The patient presents for preventative foot care services. No changes to ROS  Podiatric Exam: Vascular: dorsalis pedis and posterior tibial pulses are barely  palpable bilateral. Capillary return is immediate.Cold feet  B/L. Skin turgor WNL  Sensorium: Normal Semmes Weinstein monofilament test. Normal tactile sensation bilaterally. Nail Exam: Pt has thick disfigured discolored nails with subungual debris noted bilateral entire nail hallux through fifth toenails Ulcer Exam: There is no evidence of ulcer or pre-ulcerative changes or infection. Orthopedic Exam: Muscle tone and strength are WNL. No limitations in general ROM. No crepitus or effusions noted. Foot type and digits show no abnormalities. HAV  B/L. Skin: No Porokeratosis. No infection or ulcers  Diagnosis:  Onychomycosis, , Pain in right toe, pain in left toes  Treatment & Plan Procedures and Treatment: Consent by patient was obtained for treatment procedures. The patient understood the discussion of treatment and procedures well. All questions were answered thoroughly reviewed. Debridement of mycotic and hypertrophic toenails, 1 through 5 bilateral and clearing of subungual debris. No ulceration, no infection noted.  Signed  ABN for 2019. Return Visit-Office Procedure: Patient instructed to return to the office for a follow up visit 10 weeks  for continued evaluation and treatment.    Gardiner Barefoot DPM

## 2018-07-29 ENCOUNTER — Ambulatory Visit (INDEPENDENT_AMBULATORY_CARE_PROVIDER_SITE_OTHER): Payer: Medicare Other | Admitting: Podiatry

## 2018-07-29 ENCOUNTER — Encounter: Payer: Self-pay | Admitting: Podiatry

## 2018-07-29 DIAGNOSIS — B351 Tinea unguium: Secondary | ICD-10-CM

## 2018-07-29 DIAGNOSIS — M79676 Pain in unspecified toe(s): Secondary | ICD-10-CM

## 2018-07-29 DIAGNOSIS — M79609 Pain in unspecified limb: Principal | ICD-10-CM

## 2018-07-29 DIAGNOSIS — E1159 Type 2 diabetes mellitus with other circulatory complications: Secondary | ICD-10-CM

## 2018-07-29 NOTE — Progress Notes (Signed)
Complaint:  Visit Type: Patient returns to my office for continued preventative foot care services. Complaint: Patient states" my nails have grown long and thick and become painful to walk and wear shoes" Patient has been diagnosed with DM . The patient presents for preventative foot care services. No changes to ROS  Podiatric Exam: Vascular: dorsalis pedis and posterior tibial pulses are barely  palpable bilateral. Capillary return is immediate.Cold feet  B/L. Skin turgor WNL  Sensorium: Normal Semmes Weinstein monofilament test. Normal tactile sensation bilaterally. Nail Exam: Pt has thick disfigured discolored nails with subungual debris noted bilateral entire nail hallux through fifth toenails Ulcer Exam: There is no evidence of ulcer or pre-ulcerative changes or infection. Orthopedic Exam: Muscle tone and strength are WNL. No limitations in general ROM. No crepitus or effusions noted. Foot type and digits show no abnormalities. HAV  B/L. Skin: No Porokeratosis. No infection or ulcers  Diagnosis:  Onychomycosis, , Pain in right toe, pain in left toes  Treatment & Plan Procedures and Treatment: Consent by patient was obtained for treatment procedures. The patient understood the discussion of treatment and procedures well. All questions were answered thoroughly reviewed. Debridement of mycotic and hypertrophic toenails, 1 through 5 bilateral and clearing of subungual debris. No ulceration, no infection noted.  Signed  ABN for 2020.Marland Kitchen Return Visit-Office Procedure: Patient instructed to return to the office for a follow up visit 12 weeks  for continued evaluation and treatment.    Gardiner Barefoot DPM

## 2018-10-28 ENCOUNTER — Ambulatory Visit: Payer: Medicare Other | Admitting: Podiatry

## 2018-11-23 ENCOUNTER — Emergency Department: Payer: Medicare Other

## 2018-11-23 ENCOUNTER — Other Ambulatory Visit: Payer: Self-pay

## 2018-11-23 ENCOUNTER — Emergency Department
Admission: EM | Admit: 2018-11-23 | Discharge: 2018-11-23 | Disposition: A | Discharge status: Home / Self Care | Payer: Medicare Other | Attending: Emergency Medicine | Admitting: Emergency Medicine

## 2018-11-23 DIAGNOSIS — S0101XA Laceration without foreign body of scalp, initial encounter: Secondary | ICD-10-CM

## 2018-11-23 DIAGNOSIS — Y93E1 Activity, personal bathing and showering: Secondary | ICD-10-CM | POA: Insufficient documentation

## 2018-11-23 DIAGNOSIS — W182XXA Fall in (into) shower or empty bathtub, initial encounter: Secondary | ICD-10-CM | POA: Insufficient documentation

## 2018-11-23 DIAGNOSIS — Y929 Unspecified place or not applicable: Secondary | ICD-10-CM | POA: Insufficient documentation

## 2018-11-23 DIAGNOSIS — Y999 Unspecified external cause status: Secondary | ICD-10-CM | POA: Insufficient documentation

## 2018-11-23 DIAGNOSIS — Z87891 Personal history of nicotine dependence: Secondary | ICD-10-CM | POA: Insufficient documentation

## 2018-11-23 DIAGNOSIS — E119 Type 2 diabetes mellitus without complications: Secondary | ICD-10-CM | POA: Diagnosis not present

## 2018-11-23 DIAGNOSIS — W19XXXA Unspecified fall, initial encounter: Secondary | ICD-10-CM

## 2018-11-23 DIAGNOSIS — Z79899 Other long term (current) drug therapy: Secondary | ICD-10-CM | POA: Diagnosis not present

## 2018-11-23 DIAGNOSIS — S0990XA Unspecified injury of head, initial encounter: Secondary | ICD-10-CM | POA: Diagnosis present

## 2018-11-23 MED ORDER — ACETAMINOPHEN 500 MG PO TABS
1000.0000 mg | ORAL_TABLET | Freq: Once | ORAL | Status: AC
  Administered 2018-11-23: 08:00:00 1000 mg via ORAL
  Filled 2018-11-23: qty 2

## 2018-11-23 NOTE — ED Triage Notes (Signed)
Pt arrives via ems from home. Ems reports pt fell when getting out of shower. Pt a&o x 4,  2" laceration to crown of head, denied any LOC, skin tear to back right hand. Bleeding controlled prior to ems arrival to pt home. Pt and daughter denies pt taking any blood thinner. Also c/o mid back pain, denies head or neck pain at this time. NAD noted at this time

## 2018-11-23 NOTE — Discharge Instructions (Addendum)

## 2018-11-23 NOTE — ED Notes (Signed)
Xeroform and gauze place over skin tear on right hand and wrapped

## 2018-11-23 NOTE — ED Provider Notes (Signed)
[facility] Emergency Department Provider Note  ____________________________________________  Time seen: Approximately 7:34 AM  I have reviewed the triage vital signs and the nursing notes.   HISTORY  Chief Complaint Fall   HPI Jeremy Le. is a 85 y.o. male with history as listed below who presents for evaluation of mechanical fall.  Patient reports that he was trying to hang his towel after taking shower and lost his balance and fell.  Hit his head on the floor.  Denies LOC.  Denies being on blood thinners.  Patient is complaining of mild upper back pain that have been since the fall.  Denies headache or neck pain, hip pain or extremity pain, chest pain or abdominal pain.  He reports that the fall was mechanical in nature.  Past Medical History:  Diagnosis Date   Angiopathy    Diabetes mellitus type II, non insulin dependent ([place])    Diabetic peripheral neuropathy ([place])    Hammertoe    Onychomycosis    Osteoarthritis    Prostate cancer [facility])     Patient Active Problem List   Diagnosis Date Noted   Bipolar disorder in full remission ([place]) 03/31/2017   Exudative age-related macular degeneration ([place]) 04/21/2016   History of mood disorder 03/16/2015   GERD (gastroesophageal reflux disease) 12/13/2014   Astigmatism of both eyes 03/07/2012   Choroidal nevus of left eye 03/07/2012   POAG (primary open-angle glaucoma) 03/07/2012   Pseudophakia of both eyes 03/07/2012   Anemia, unspecified 11/07/2011   Controlled type 2 diabetes mellitus without complication, without long-term current use of insulin ([place]) 09/19/2011    Past Surgical History:  Procedure Laterality Date   EYE SURGERY Bilateral    Lymphnode resection     PROSTATE SURGERY      Prior to Admission medications   Medication Sig Start Date End Date Taking? Authorizing Provider  atorvastatin (LIPITOR) 10 MG tablet Take 10 mg by mouth daily at 6 PM.  05/23/13  Yes  [provider]  buPROPion (WELLBUTRIN XL) 150 MG 24 hr tablet Take 150 mg by mouth daily.    Yes [provider]  lithium carbonate 300 MG capsule Take 150 mg by mouth daily.  04/25/13  Yes [provider]  losartan (COZAAR) 25 MG tablet Take 25 mg by mouth daily. 11/10/18  Yes [provider]  Multiple Vitamins-Minerals (CENTRUM SILVER 50+MEN PO) Take 1 tablet by mouth daily.    Yes [provider]  omeprazole (PRILOSEC) 20 MG capsule Take 20 mg by mouth daily.  05/31/13  Yes [provider]  polyethylene glycol (MIRALAX / GLYCOLAX) 17 g packet Take 17 g by mouth daily as needed.   Yes [provider]  timolol (TIMOPTIC) 0.5 % ophthalmic solution Place 1 drop into both eyes 2 (two) times daily.  02/15/16  Yes [provider]  Abby Potash LANCETS [place]  04/27/13   [provider]  neomycin-polymyxin b-dexamethasone (MAXITROL) 3.5-10000-0.1 OINT Place 1 application into the left eye at bedtime.  02/14/16   [provider]  ONE TOUCH ULTRA TEST test strip  04/25/13   [provider]    Allergies Patient has no known allergies.  Family History  Problem Relation Age of Onset   Diabetes Mother     Social History Social History   Tobacco Use   Smoking status: Former Smoker    Types: Cigarettes   Smokeless tobacco: Never Used   Tobacco comment: quit years ago  Substance Use  Topics   Alcohol use: No   Drug use: No    Review of Systems Constitutional: Negative for fever. Eyes: Negative for visual changes. ENT: Negative for facial injury or neck injury Cardiovascular: Negative for chest injury. Respiratory: Negative for shortness of breath. Negative for chest wall injury. Gastrointestinal: Negative for abdominal pain or injury. Genitourinary: Negative for dysuria. Musculoskeletal: + upper back injury, negative for arm or leg pain. Skin: + forehead laceration Neurological: + head  injury.  ____________________________________________   PHYSICAL EXAM:  VITAL SIGNS: ED Triage Vitals  Enc Vitals Group     BP 11/23/18 0714 (!) 160/61     Pulse Rate 11/23/18 0714 69     Resp 11/23/18 0714 14     Temp 11/23/18 0714 97.8 F (36.6 C)     Temp Source 11/23/18 0714 Oral     SpO2 11/23/18 0708 94 %     Weight 11/23/18 0716 158 lb (71.7 kg)     Height 11/23/18 0716 5\' 8"  (1.727 m)     Head Circumference --      Peak Flow --      Pain Score 11/23/18 0715 0     Pain Loc --      Pain Edu? --      Excl. in [place]? --    Full spinal precautions maintained throughout the trauma exam. Constitutional: Alert and oriented. No acute distress. Does not appear intoxicated. HEENT Head: Normocephalic. One shallow left parietal laceration Face: No facial bony tenderness. Stable midface Ears: No hemotympanum bilaterally. No Battle sign Eyes: No eye injury. PERRL. No raccoon eyes Nose: Nontender. No epistaxis. No rhinorrhea Mouth/Throat: Mucous membranes are moist. No oropharyngeal blood. No dental injury. Airway patent without stridor. Normal voice. Neck: no C-collar in place. No midline c-spine tenderness.  Cardiovascular: Normal rate, regular rhythm. Normal and symmetric distal pulses are present in all extremities. Pulmonary/Chest: Chest wall is stable and nontender to palpation/compression. Normal respiratory effort. Breath sounds are normal. No crepitus.  Abdominal: Soft, nontender, non distended. Musculoskeletal: Nontender with normal full range of motion in all extremities. No deformities. Upper thoracic tenderness to palpation. No lumbar midline spinal tenderness. Pelvis is stable. Skin: Skin is warm, dry and intact. No abrasions or contutions. Psychiatric: Speech and behavior are appropriate. Neurological: Normal speech and language. Moves all extremities to command. No gross focal neurologic deficits are appreciated.  Glascow Coma Score: 4 - Opens eyes on own 6 -  Follows simple motor commands 5 - Alert and oriented GCS: 15   ____________________________________________   LABS (all labs ordered are listed, but only abnormal results are displayed)  Labs Reviewed - No data to display ____________________________________________  EKG  ED ECG REPORT I, Rudene Re, the attending physician, personally viewed and interpreted this ECG.  Normal sinus rhythm, rate of 69, first-degree AV block, normal QTC, normal axis, right bundle branch block, no ST elevations or depressions.  No recent prior for comparison. ____________________________________________  RADIOLOGY  I have personally reviewed the images performed during this visit and I agree with the Radiologist's read.   Interpretation by Radiologist:  Dg Thoracic Spine 2 View  Result Date: 11/23/2018 CLINICAL DATA:  Fall. EXAM: THORACIC SPINE 2 VIEWS COMPARISON:  CT chest dated July 21, 2007. FINDINGS: The upper thoracic spine is not well visualized due to overlapping structures. Twelve rib-bearing thoracic vertebral bodies. Exaggerated thoracic kyphosis. No acute fracture or subluxation. Vertebral body heights are preserved. Alignment is normal. Scattered anterior endplate osteophytes throughout the thoracic spine. Intervertebral  disc spaces are relatively maintained. IMPRESSION: No acute osseous abnormality.  A Electronically Signed   By: Titus Dubin M.D.   On: 11/23/2018 08:05   Ct Head Wo Contrast  Result Date: 11/23/2018 CLINICAL DATA:  Fall getting out of the shower. EXAM: CT HEAD WITHOUT CONTRAST CT CERVICAL SPINE WITHOUT CONTRAST TECHNIQUE: Multidetector CT imaging of the head and cervical spine was performed following the standard protocol without intravenous contrast. Multiplanar CT image reconstructions of the cervical spine were also generated. COMPARISON:  CT head dated January 20, 2014. FINDINGS: CT HEAD FINDINGS Brain: No evidence of acute infarction, hemorrhage, hydrocephalus,  extra-axial collection or mass effect. Stable mild atrophy and chronic microvascular ischemic changes. Unchanged partially calcified 1.1 cm dural based lesion at the right vertex adjacent to the superior sagittal sinus, likely a meningioma. Vascular: Calcified atherosclerosis at the skullbase. No hyperdense vessel. Skull: Normal. Negative for fracture or focal lesion. Sinuses/Orbits: No acute finding. Other: Small left-sided scalp laceration at the vertex. CT CERVICAL SPINE FINDINGS Alignment: Normal. Skull base and vertebrae: No acute fracture. No primary bone lesion or focal pathologic process. Soft tissues and spinal canal: No prevertebral fluid or swelling. No visible canal hematoma. Disc levels: Disc heights are relatively preserved. Posterior disc osteophyte complex at C6-C7. Mild uncovertebral hypertrophy at C5-C6 and C6-C7. Moderate left facet arthropathy at C2-C3. Prominent anterior endplate osteophytes. Upper chest: Negative. Other: None. IMPRESSION: 1. No acute intracranial abnormality. Small left sided scalp laceration at the vertex. 2. No acute cervical spine fracture. Mild for age cervical spondylosis. Electronically Signed   By: Titus Dubin M.D.   On: 11/23/2018 08:25   Ct Cervical Spine Wo Contrast  Result Date: 11/23/2018 CLINICAL DATA:  Fall getting out of the shower. EXAM: CT HEAD WITHOUT CONTRAST CT CERVICAL SPINE WITHOUT CONTRAST TECHNIQUE: Multidetector CT imaging of the head and cervical spine was performed following the standard protocol without intravenous contrast. Multiplanar CT image reconstructions of the cervical spine were also generated. COMPARISON:  CT head dated January 20, 2014. FINDINGS: CT HEAD FINDINGS Brain: No evidence of acute infarction, hemorrhage, hydrocephalus, extra-axial collection or mass effect. Stable mild atrophy and chronic microvascular ischemic changes. Unchanged partially calcified 1.1 cm dural based lesion at the right vertex adjacent to the superior  sagittal sinus, likely a meningioma. Vascular: Calcified atherosclerosis at the skullbase. No hyperdense vessel. Skull: Normal. Negative for fracture or focal lesion. Sinuses/Orbits: No acute finding. Other: Small left-sided scalp laceration at the vertex. CT CERVICAL SPINE FINDINGS Alignment: Normal. Skull base and vertebrae: No acute fracture. No primary bone lesion or focal pathologic process. Soft tissues and spinal canal: No prevertebral fluid or swelling. No visible canal hematoma. Disc levels: Disc heights are relatively preserved. Posterior disc osteophyte complex at C6-C7. Mild uncovertebral hypertrophy at C5-C6 and C6-C7. Moderate left facet arthropathy at C2-C3. Prominent anterior endplate osteophytes. Upper chest: Negative. Other: None. IMPRESSION: 1. No acute intracranial abnormality. Small left sided scalp laceration at the vertex. 2. No acute cervical spine fracture. Mild for age cervical spondylosis. Electronically Signed   By: Titus Dubin M.D.   On: 11/23/2018 08:25      ____________________________________________   PROCEDURES  Procedure(s) performed:yes .Marland KitchenLaceration Repair Date/Time: 11/23/2018 7:46 AM Performed by: Rudene Re, MD Authorized by: Rudene Re, MD   Consent:    Consent obtained:  Verbal   Consent given by:  Patient   Risks discussed:  Infection, pain, retained foreign body, poor cosmetic result and poor wound healing Laceration details:    Location:  Scalp   Scalp location:  L parietal   Length (cm):  1 Repair type:    Repair type:  Simple Exploration:    Hemostasis achieved with:  Direct pressure   Wound exploration: entire depth of wound probed and visualized     Wound extent: no fascia violation noted, no foreign bodies/material noted and no underlying fracture noted     Contaminated: no   Treatment:    Area cleansed with:  Saline   Amount of cleaning:  Extensive   Irrigation solution:  Sterile saline   Visualized foreign  bodies/material removed: no   Skin repair:    Repair method:  Tissue adhesive (Hair apposition technique) Approximation:    Approximation:  Close Post-procedure details:    Dressing:  Open (no dressing)   Patient tolerance of procedure:  Tolerated well, no immediate complications   Critical Care performed:  None ____________________________________________   INITIAL IMPRESSION / ASSESSMENT AND PLAN / ED COURSE  85 y.o. male with history as listed below who presents for evaluation of mechanical fall with head trauma.  Patient with a very small and shallow scalp laceration which was repaired with hair apposition technique and Dermabond.  CT head and neck showed no acute findings. XR of the thoracic spine showed no fractures. Tetanus up to date (2016). Tylenol given for pain. EKG with no evidence of dysrhythmias. Patient will be dc home to the care of his daughter.  My standard return precautions were discussed as well as close follow-up with primary care doctor.      As part of my medical decision making, I reviewed the following data within the [facility] notes reviewed and incorporated, EKG interpreted , Old chart reviewed, Radiograph reviewed , Notes from prior ED visits and [place] Controlled Substance Database    Pertinent labs & imaging results that were available during my care of the patient were reviewed by me and considered in my medical decision making (see chart for details).    ____________________________________________   FINAL CLINICAL IMPRESSION(S) / ED DIAGNOSES  Final diagnoses:  Fall, initial encounter  Injury of head, initial encounter  Laceration of scalp, initial encounter      NEW MEDICATIONS STARTED DURING THIS VISIT:  ED Discharge Orders    None       Note:  This document was prepared using Dragon voice recognition software and may include unintentional dictation errors.    Alfred Levins, [place], MD 11/23/18 [phone]

## 2018-11-23 NOTE — ED Notes (Signed)
Patient transported to X-ray 

## 2018-11-23 NOTE — ED Notes (Signed)
dermabond glue used to close small laceration on the head

## 2019-05-06 ENCOUNTER — Ambulatory Visit (INDEPENDENT_AMBULATORY_CARE_PROVIDER_SITE_OTHER): Payer: Medicare Other | Admitting: Podiatry

## 2019-05-06 ENCOUNTER — Encounter: Payer: Self-pay | Admitting: Podiatry

## 2019-05-06 ENCOUNTER — Other Ambulatory Visit: Payer: Self-pay

## 2019-05-06 DIAGNOSIS — L600 Ingrowing nail: Secondary | ICD-10-CM | POA: Diagnosis not present

## 2019-05-06 MED ORDER — GENTAMICIN SULFATE 0.1 % EX CREA: 1.0000 "application " | TOPICAL_CREAM | Freq: Two times a day (BID) | CUTANEOUS | 1 refills | Status: DC

## 2019-05-09 NOTE — Progress Notes (Signed)
   Subjective: Patient presents today for evaluation of pain to the lateral border of the left great toe that began about five days ago. He reports associated redness, swelling and drainage. Patient is concerned for possible ingrown nail. Applying pressure to the toe increases the pain. He was prescribed Keflex TID x 10 days by his PCP which has been helping to improve the symptoms. Patient presents today for further treatment and evaluation.  Past Medical History:  Diagnosis Date  . Angiopathy   . Diabetes mellitus type II, non insulin dependent ([place])   . Diabetic peripheral neuropathy ([place])   . Hammertoe   . Onychomycosis   . Osteoarthritis   . Prostate cancer ([place])     Objective:  General: Well developed, nourished, in no acute distress, alert and oriented x3   Dermatology: Skin is warm, dry and supple bilateral. Lateral border left great toe appears to be erythematous with evidence of an ingrowing nail. Pain on palpation noted to the border of the nail fold. The remaining nails appear unremarkable at this time. There are no open sores, lesions.  Vascular: Dorsalis Pedis artery and Posterior Tibial artery pedal pulses palpable. No lower extremity edema noted.   Neruologic: Grossly intact via light touch bilateral.  Musculoskeletal: Muscular strength within normal limits in all groups bilateral. Normal range of motion noted to all pedal and ankle joints.   Assesement: #1 Paronychia with ingrowing nail lateral border left great toe #2 Pain in toe #3 Incurvated nail  Plan of Care:  1. Patient evaluated.  2. Discussed treatment alternatives and plan of care. Explained nail avulsion procedure and post procedure course to patient. 3. Patient opted for permanent partial nail avulsion of the lateral border left great toe.  4. Prior to procedure, local anesthesia infiltration utilized using 3 ml of a 50:50 mixture of 2% plain lidocaine and 0.5% plain marcaine in a normal hallux block  fashion and a betadine prep performed.  5. Partial permanent nail avulsion with chemical matrixectomy performed using [id number] applications of phenol followed by alcohol flush.  6. Light dressing applied. 7. Continue taking Keflex as prescribed three times daily for ten days.  8. Prescription for Gentamicin cream provided to patient to use daily with a bandage.  9. Return to clinic as needed.  Edrick Kins, DPM [facility]  Dr. Edrick Kins, [address]                                        [place], [place] [address]                Office [phone]  Fax [phone]

## 2019-09-22 ENCOUNTER — Encounter: Payer: Medicare Other | Attending: Physician Assistant | Admitting: Physician Assistant

## 2019-09-22 ENCOUNTER — Other Ambulatory Visit: Payer: Self-pay

## 2019-09-22 DIAGNOSIS — M6281 Muscle weakness (generalized): Secondary | ICD-10-CM | POA: Insufficient documentation

## 2019-09-22 DIAGNOSIS — E1151 Type 2 diabetes mellitus with diabetic peripheral angiopathy without gangrene: Secondary | ICD-10-CM | POA: Diagnosis not present

## 2019-09-22 DIAGNOSIS — I10 Essential (primary) hypertension: Secondary | ICD-10-CM | POA: Insufficient documentation

## 2019-09-22 DIAGNOSIS — S40022A Contusion of left upper arm, initial encounter: Secondary | ICD-10-CM | POA: Insufficient documentation

## 2019-09-22 DIAGNOSIS — S300XXA Contusion of lower back and pelvis, initial encounter: Secondary | ICD-10-CM | POA: Diagnosis not present

## 2019-09-22 DIAGNOSIS — W19XXXA Unspecified fall, initial encounter: Secondary | ICD-10-CM | POA: Insufficient documentation

## 2019-09-22 NOTE — Progress Notes (Signed)
Jeremy Le, Jeremy Le ([id number]) Visit Report for 09/22/2019 Allergy List Details Patient Name: Jeremy Le, Jeremy Le Date of Service: 09/22/2019 8:00 AM Medical Record Number: [id number] Patient Account Number: [id number] Date of Birth/Sex: 12/05/1921 (86 y.o. M) Treating RN: Montey Hora Primary Care Oretha Weismann: Paulita Cradle Other Clinician: Referring Ivey Cina: Paulita Cradle Treating Cammy Sanjurjo/Extender: STONE III, HOYT Weeks in Treatment: 0 Allergies Active Allergies No Known Allergies Allergy Notes Electronic Signature(s) Signed: 09/22/2019 4:34:11 PM By: Montey Hora Entered By: Montey Hora on 09/22/2019 08:18:19 Jeremy Le ([id number]) -------------------------------------------------------------------------------- Arrival Information Details Patient Name: Jeremy Le Date of Service: 09/22/2019 8:00 AM Medical Record Number: [id number] Patient Account Number: [id number] Date of Birth/Sex: 01/06/1922 (86 y.o. M) Treating RN: Montey Hora Primary Care Demyan Fugate: Paulita Cradle Other Clinician: Referring Torrin Crihfield: Paulita Cradle Treating Daltyn Degroat/Extender: Melburn Hake, HOYT Weeks in Treatment: 0 Visit Information Patient Arrived: Wheel Chair Arrival Time: 08:16 Accompanied By: spouse Transfer Assistance: None Patient Identification Verified: Yes Secondary Verification Process Completed: Yes History Since Last Visit Added or deleted any medications: No Any new allergies or adverse reactions: No Had a fall or experienced change in activities of daily living that may affect risk of falls: No Signs or symptoms of abuse/neglect since last visito No Hospitalized since last visit: No Implantable device outside of the clinic excluding cellular tissue based products placed in the center since last visit: No Electronic Signature(s) Signed: 09/22/2019 4:34:11 PM By: Montey Hora Entered By: Montey Hora on 09/22/2019 08:16:44 Jeremy Le  ([id number]) -------------------------------------------------------------------------------- Clinic Level of Care Assessment Details Patient Name: Jeremy Le Date of Service: 09/22/2019 8:00 AM Medical Record Number: [id number] Patient Account Number: [id number] Date of Birth/Sex: 1922-07-02 (86 y.o. M) Treating RN: Army Melia Primary Care Denny Lave: Paulita Cradle Other Clinician: Referring Makaylen Thieme: Paulita Cradle Treating Decklyn Hornik/Extender: Melburn Hake, HOYT Weeks in Treatment: 0 Clinic Level of Care Assessment Items TOOL 2 Quantity Score []  - Use when only an EandM is performed on the INITIAL visit 0 ASSESSMENTS - Nursing Assessment / Reassessment X - General Physical Exam (combine w/ comprehensive assessment (listed just below) when performed on new pt. 1 20 evals) X- 1 25 Comprehensive Assessment (HX, ROS, Risk Assessments, Wounds Hx, etc.) ASSESSMENTS - Wound and Skin Assessment / Reassessment []  - Simple Wound Assessment / Reassessment - one wound 0 X- 2 5 Complex Wound Assessment / Reassessment - multiple wounds []  - 0 Dermatologic / Skin Assessment (not related to wound area) ASSESSMENTS - Ostomy and/or Continence Assessment and Care []  - Incontinence Assessment and Management 0 []  - 0 Ostomy Care Assessment and Management (repouching, etc.) PROCESS - Coordination of Care X - Simple Patient / Family Education for ongoing care 1 15 []  - 0 Complex (extensive) Patient / Family Education for ongoing care []  - 0 Staff obtains Programmer, systems, Records, Test Results / Process Orders []  - 0 Staff telephones HHA, Nursing Homes / Clarify orders / etc []  - 0 Routine Transfer to another Facility (non-emergent condition) []  - 0 Routine Hospital Admission (non-emergent condition) X- 1 15 New Admissions / Biomedical engineer / Ordering NPWT, Apligraf, etc. []  - 0 Emergency Hospital Admission (emergent condition) X- 1 10 Simple Discharge Coordination []  - 0 Complex  (extensive) Discharge Coordination PROCESS - Special Needs []  - Pediatric / Minor Patient Management 0 []  - 0 Isolation Patient Management []  - 0 Hearing / Language / Visual special needs []  - 0 Assessment of Community assistance (transportation, D/C planning, etc.) []  - 0 Additional assistance / Altered mentation []  - 0 Support Surface(s) Assessment (bed, cushion, seat, etc.)  INTERVENTIONS - Wound Cleansing / Measurement []  - Wound Imaging (photographs - any number of wounds) 0 Jeremy Le, Jeremy Le ([id number]) X- 1 5 Wound Tracing (instead of photographs) []  - 0 Simple Wound Measurement - one wound X- 2 5 Complex Wound Measurement - multiple wounds []  - 0 Simple Wound Cleansing - one wound X- 2 5 Complex Wound Cleansing - multiple wounds INTERVENTIONS - Wound Dressings []  - Small Wound Dressing one or multiple wounds 0 X- 2 15 Medium Wound Dressing one or multiple wounds []  - 0 Large Wound Dressing one or multiple wounds []  - 0 Application of Medications - injection INTERVENTIONS - Miscellaneous []  - External ear exam 0 []  - 0 Specimen Collection (cultures, biopsies, blood, body fluids, etc.) []  - 0 Specimen(s) / Culture(s) sent or taken to Lab for analysis []  - 0 Patient Transfer (multiple staff / Civil Service fast streamer / Similar devices) []  - 0 Simple Staple / Suture removal (25 or less) []  - 0 Complex Staple / Suture removal (26 or more) []  - 0 Hypo / Hyperglycemic Management (close monitor of Blood Glucose) []  - 0 Ankle / Brachial Index (ABI) - do not check if billed separately Has the patient been seen at the hospital within the last three years: Yes Total Score: 150 Level Of Care: New/Established - Level 4 Electronic Signature(s) Signed: 09/22/2019 3:26:37 PM By: Army Melia Entered By: Army Melia on 09/22/2019 08:58:21 Jeremy Le ([id number]) -------------------------------------------------------------------------------- Encounter Discharge Information  Details Patient Name: Jeremy Le Date of Service: 09/22/2019 8:00 AM Medical Record Number: [id number] Patient Account Number: [id number] Date of Birth/Sex: Oct 06, 1921 (86 y.o. M) Treating RN: Army Melia Primary Care Giulian Goldring: Paulita Cradle Other Clinician: Referring Trenise Turay: Paulita Cradle Treating Malley Hauter/Extender: Melburn Hake, HOYT Weeks in Treatment: 0 Encounter Discharge Information Items Discharge Condition: Stable Ambulatory Status: Wheelchair Discharge Destination: Home Transportation: Private Auto Accompanied By: daughter Schedule Follow-up Appointment: Yes Clinical Summary of Care: Electronic Signature(s) Signed: 09/22/2019 3:26:37 PM By: Army Melia Entered By: Army Melia on 09/22/2019 08:59:23 Jeremy Le ([id number]) -------------------------------------------------------------------------------- Lower Extremity Assessment Details Patient Name: Jeremy Le Date of Service: 09/22/2019 8:00 AM Medical Record Number: [id number] Patient Account Number: [id number] Date of Birth/Sex: 1921/10/02 (86 y.o. M) Treating RN: Montey Hora Primary Care Sema Stangler: Paulita Cradle Other Clinician: Referring Yug Loria: Paulita Cradle Treating Jeraline Marcinek/Extender: Melburn Hake, HOYT Weeks in Treatment: 0 Electronic Signature(s) Signed: 09/22/2019 4:34:11 PM By: Montey Hora Entered By: Montey Hora on 09/22/2019 08:19:51 Jeremy Le ([id number]) -------------------------------------------------------------------------------- Multi Wound Chart Details Patient Name: Jeremy Le Date of Service: 09/22/2019 8:00 AM Medical Record Number: [id number] Patient Account Number: [id number] Date of Birth/Sex: [birth year] (86 y.o. M) Treating RN: Army Melia Primary Care Honor Frison: Paulita Cradle Other Clinician: Referring Jonay Hitchcock: Paulita Cradle Treating Belynda Pagaduan/Extender: Melburn Hake, HOYT Weeks in Treatment: 0 Vital Signs Height(in): 73 Pulse(bpm): 84 Weight(lbs):  165 Blood Pressure(mmHg): 130/44 Body Mass Index(BMI): 26 Temperature(F): 98.7 Respiratory Rate(breaths/min): 16 Photos: [N/A:N/A] Wound Location: Left Upper Arm Back - Midline N/A Wounding Event: Trauma Trauma N/A Primary Etiology: Skin Tear Trauma, Other N/A Comorbid History: Glaucoma, Anemia, Hypertension, Glaucoma, Anemia, Hypertension, N/A Type II Diabetes Type II Diabetes Date Acquired: 09/16/2019 09/16/2019 N/A Weeks of Treatment: 0 0 N/A Wound Status: Open Open N/A Measurements L x W x D (cm) 0.6x0.8x0.1 1.2x1.5x0.1 N/A Area (cm) : 0.377 1.414 N/A Volume (cm) : 0.038 0.141 N/A % Reduction in Area: N/A 0.00% N/A % Reduction in Volume: N/A 0.00% N/A Classification: Full Thickness Without Exposed Full Thickness Without Exposed N/A Support Structures Support Structures Exudate Amount: Medium  Medium N/A Exudate Type: Serous Serous N/A Exudate Color: amber amber N/A Wound Margin: Flat and Intact Flat and Intact N/A Granulation Amount: Small (1-33%) Large (67-100%) N/A Granulation Quality: Pink Pink N/A Necrotic Amount: Large (67-100%) Small (1-33%) N/A Exposed Structures: Fat Layer (Subcutaneous Tissue) Fat Layer (Subcutaneous Tissue) N/A Exposed: Yes Exposed: Yes Fascia: No Fascia: No Tendon: No Tendon: No Muscle: No Muscle: No Joint: No Joint: No Bone: No Bone: No Epithelialization: None Small (1-33%) N/A Assessment Notes: N/A patient has a small skin tear in the N/A center of a large hematoma measuring 13 x 10cm Treatment Notes Electronic Signature(s) Jeremy Le, Jeremy Le ([id number]) Signed: 09/22/2019 3:26:37 PM By: Army Melia Entered By: Army Melia on 09/22/2019 08:51:32 Jeremy Le ([id number]) -------------------------------------------------------------------------------- Multi-Disciplinary Care Plan Details Patient Name: Jeremy Le Date of Service: 09/22/2019 8:00 AM Medical Record Number: [id number] Patient Account Number: [id number] Date of Birth/Sex:  September 21, 1921 (86 y.o. M) Treating RN: Army Melia Primary Care Janaiya Beauchesne: Paulita Cradle Other Clinician: Referring Belia Febo: Paulita Cradle Treating Ronie Barnhart/Extender: Melburn Hake, HOYT Weeks in Treatment: 0 Active Inactive Abuse / Safety / Falls / Self Care Management Nursing Diagnoses: History of Falls Goals: Patient/caregiver will identify factors that restrict self-care and home management Date Initiated: 09/22/2019 Target Resolution Date: 10/28/2019 Goal Status: Active Interventions: Provide education on fall prevention Notes: Orientation to the Wound Care Program Nursing Diagnoses: Knowledge deficit related to the wound healing center program Goals: Patient/caregiver will verbalize understanding of the [facility] Program Date Initiated: 09/22/2019 Target Resolution Date: 10/28/2019 Goal Status: Active Interventions: Provide education on orientation to the wound center Notes: Wound/Skin Impairment Nursing Diagnoses: Impaired tissue integrity Goals: Ulcer/skin breakdown will have a volume reduction of 30% by week 4 Date Initiated: 09/22/2019 Target Resolution Date: 10/28/2019 Goal Status: Active Interventions: Assess ulceration(s) every visit Notes: Electronic Signature(s) Signed: 09/22/2019 3:26:37 PM By: Army Melia Entered By: Army Melia on 09/22/2019 08:51:14 Jeremy Le ([id number]) Jeremy Le ([id number]) -------------------------------------------------------------------------------- Pain Assessment Details Patient Name: Jeremy Le Date of Service: 09/22/2019 8:00 AM Medical Record Number: [id number] Patient Account Number: [id number] Date of Birth/Sex: 18-Aug-1921 (86 y.o. M) Treating RN: Montey Hora Primary Care Lashayla Armes: Paulita Cradle Other Clinician: Referring Kemauri Musa: Paulita Cradle Treating Jakorey Mcconathy/Extender: Melburn Hake, HOYT Weeks in Treatment: 0 Active Problems Location of Pain Severity and Description of Pain Patient Has Paino  No Site Locations Pain Management and Medication Current Pain Management: Electronic Signature(s) Signed: 09/22/2019 4:34:11 PM By: Montey Hora Entered By: Montey Hora on 09/22/2019 08:18:14 Jeremy Le ([id number]) -------------------------------------------------------------------------------- Patient/Caregiver Education Details Patient Name: Jeremy Le Date of Service: 09/22/2019 8:00 AM Medical Record Number: [id number] Patient Account Number: [id number] Date of Birth/Gender: 1921-09-22 (86 y.o. M) Treating RN: Army Melia Primary Care Physician: Paulita Cradle Other Clinician: Referring Physician: Paulita Cradle Treating Physician/Extender: Sharalyn Ink in Treatment: 0 Education Assessment Education Provided To: Patient Education Topics Provided Wound/Skin Impairment: Handouts: Caring for Your Ulcer Methods: Demonstration, Explain/Verbal Responses: State content correctly Electronic Signature(s) Signed: 09/22/2019 3:26:37 PM By: Army Melia Entered By: Army Melia on 09/22/2019 08:58:35 Jeremy Le ([id number]) -------------------------------------------------------------------------------- Wound Assessment Details Patient Name: Jeremy Le Date of Service: 09/22/2019 8:00 AM Medical Record Number: [id number] Patient Account Number: [id number] Date of Birth/Sex: 10/25/1921 (86 y.o. M) Treating RN: Montey Hora Primary Care Alistar Mcenery: Paulita Cradle Other Clinician: Referring Cacey Willow: Paulita Cradle Treating Johnmark Geiger/Extender: STONE III, HOYT Weeks in Treatment: 0 Wound Status Wound Number: 5 Primary Etiology: Skin Tear Wound Location: Left Upper Arm Wound Status: Open Wounding Event: Trauma Comorbid History: Glaucoma, Anemia, Hypertension, Type  II Diabetes Date Acquired: 09/16/2019 Weeks Of Treatment: 0 Clustered Wound: No Photos Wound Measurements Length: (cm) 0.6 Width: (cm) 0.8 Depth: (cm) 0.1 Area: (cm) 0.377 Volume: (cm)  0.038 % Reduction in Area: % Reduction in Volume: Epithelialization: None Tunneling: No Undermining: No Wound Description Classification: Full Thickness Without Exposed Support Structu Wound Margin: Flat and Intact Exudate Amount: Medium Exudate Type: Serous Exudate Color: amber res Foul Odor After Cleansing: No Slough/Fibrino Yes Wound Bed Granulation Amount: Small (1-33%) Exposed Structure Granulation Quality: Pink Fascia Exposed: No Necrotic Amount: Large (67-100%) Fat Layer (Subcutaneous Tissue) Exposed: Yes Necrotic Quality: Adherent Slough Tendon Exposed: No Muscle Exposed: No Joint Exposed: No Bone Exposed: No Treatment Notes Wound #5 (Left Upper Arm) Notes xeroform, abd, conform on arm with stretch net, Electronic Signature(s) Signed: 09/22/2019 4:34:11 PM By: Jeremy Le, Jeremy Le ([id number]) Entered By: Montey Hora on 09/22/2019 08:33:13 Jeremy Le ([id number]) -------------------------------------------------------------------------------- Wound Assessment Details Patient Name: Jeremy Le Date of Service: 09/22/2019 8:00 AM Medical Record Number: [id number] Patient Account Number: [id number] Date of Birth/Sex: 07-Feb-1922 (86 y.o. M) Treating RN: Montey Hora Primary Care Shanya Ferriss: Paulita Cradle Other Clinician: Referring Melvina Pangelinan: Paulita Cradle Treating Waldon Sheerin/Extender: Melburn Hake, HOYT Weeks in Treatment: 0 Wound Status Wound Number: 6 Primary Etiology: Trauma, Other Wound Location: Back - Midline Wound Status: Open Wounding Event: Trauma Comorbid History: Glaucoma, Anemia, Hypertension, Type II Diabetes Date Acquired: 09/16/2019 Weeks Of Treatment: 0 Clustered Wound: No Photos Wound Measurements Length: (cm) 1.2 Width: (cm) 1.5 Depth: (cm) 0.1 Area: (cm) 1.414 Volume: (cm) 0.141 % Reduction in Area: 0% % Reduction in Volume: 0% Epithelialization: Small (1-33%) Tunneling: No Undermining: No Wound  Description Classification: Full Thickness Without Exposed Support Structures Wound Margin: Flat and Intact Exudate Amount: Medium Exudate Type: Serous Exudate Color: amber Foul Odor After Cleansing: No Slough/Fibrino Yes Wound Bed Granulation Amount: Large (67-100%) Exposed Structure Granulation Quality: Pink Fascia Exposed: No Necrotic Amount: Small (1-33%) Fat Layer (Subcutaneous Tissue) Exposed: Yes Necrotic Quality: Adherent Slough Tendon Exposed: No Muscle Exposed: No Joint Exposed: No Bone Exposed: No Assessment Notes patient has a small skin tear in the center of a large hematoma measuring 13 x 10cm Treatment Notes Wound #6 (Midline Back) Notes xeroform, abd, conform on arm with stretch net, Jeremy Le, Jeremy Le ([id number]) Electronic Signature(s) Signed: 09/22/2019 4:34:11 PM By: Montey Hora Entered By: Montey Hora on 09/22/2019 08:35:42 Jeremy Le ([id number]) -------------------------------------------------------------------------------- Vitals Details Patient Name: Jeremy Le Date of Service: 09/22/2019 8:00 AM Medical Record Number: [id number] Patient Account Number: [id number] Date of Birth/Sex: 04-Feb-1922 (86 y.o. M) Treating RN: Montey Hora Primary Care Sharmayne Jablon: Paulita Cradle Other Clinician: Referring Elieser Tetrick: Paulita Cradle Treating Wilene Pharo/Extender: Melburn Hake, HOYT Weeks in Treatment: 0 Vital Signs Time Taken: 08:19 Temperature (F): 98.7 Height (in): 67 Pulse (bpm): 61 Source: Stated Respiratory Rate (breaths/min): 16 Weight (lbs): 165 Blood Pressure (mmHg): 130/44 Source: Stated Reference Range: 80 - 120 mg / dl Body Mass Index (BMI): 25.8 Electronic Signature(s) Signed: 09/22/2019 4:34:11 PM By: Montey Hora Entered By: Montey Hora on 09/22/2019 08:19:44

## 2019-09-22 NOTE — Progress Notes (Signed)
ROHAAN, BONAWITZ ([id number]) Visit Report for 09/22/2019 Abuse/Suicide Risk Screen Details Patient Name: Jeremy Le, Jeremy Le Date of Service: 09/22/2019 8:00 AM Medical Record Number: [id number] Patient Account Number: [id number] Date of Birth/Sex: February 20, 1922 (86 y.o. M) Treating RN: Montey Hora Primary Care Robyn Galati: Paulita Cradle Other Clinician: Referring Oswell Say: Paulita Cradle Treating Carlene Bickley/Extender: Melburn Hake, HOYT Weeks in Treatment: 0 Abuse/Suicide Risk Screen Items Answer ABUSE RISK SCREEN: Has anyone close to you tried to hurt or harm you recentlyo No Do you feel uncomfortable with anyone in your familyo No Has anyone forced you do things that you didnot want to doo No Electronic Signature(s) Signed: 09/22/2019 4:34:11 PM By: Montey Hora Entered By: Montey Hora on 09/22/2019 08:22:06 Jeremy Le ([id number]) -------------------------------------------------------------------------------- Activities of Daily Living Details Patient Name: Jeremy Le Date of Service: 09/22/2019 8:00 AM Medical Record Number: [id number] Patient Account Number: [id number] Date of Birth/Sex: Nov 26, 1921 (86 y.o. M) Treating RN: Montey Hora Primary Care Jasmeet Manton: Paulita Cradle Other Clinician: Referring Hugh Garrow: Paulita Cradle Treating Valjean Ruppel/Extender: Melburn Hake, HOYT Weeks in Treatment: 0 Activities of Daily Living Items Answer Activities of Daily Living (Please select one for each item) Drive Automobile Not Able Take Medications Need Assistance Use Telephone Need Assistance Care for Appearance Need Assistance Use Toilet Need Assistance Bath / Shower Need Assistance Dress Self Need Assistance Feed Self Completely Able Walk Need Assistance Get In / Out Bed Need Assistance Housework Not Able Prepare Meals Not Able Handle Money Need Assistance Shop for Self Need Assistance Electronic Signature(s) Signed: 09/22/2019 4:34:11 PM By: Montey Hora Entered By: Montey Hora on 09/22/2019 08:22:59 Jeremy Le ([id number]) -------------------------------------------------------------------------------- Education Screening Details Patient Name: Jeremy Le Date of Service: 09/22/2019 8:00 AM Medical Record Number: [id number] Patient Account Number: [id number] Date of Birth/Sex: [birth year] (86 y.o. M) Treating RN: Montey Hora Primary Care Emersynn Deatley: Paulita Cradle Other Clinician: Referring Jonta Gastineau: Paulita Cradle Treating Eleesha Purkey/Extender: Melburn Hake, HOYT Weeks in Treatment: 0 Primary Learner Assessed: Patient Learning Preferences/Education Level/Primary Language Learning Preference: Explanation, Demonstration Highest Education Level: College or Above Preferred Language: English Cognitive Barrier Language Barrier: No Translator Needed: No Memory Deficit: No Emotional Barrier: No Cultural/Religious Beliefs Affecting Medical Care: No Physical Barrier Impaired Vision: No Impaired Hearing: No Decreased Hand dexterity: No Knowledge/Comprehension Knowledge Level: Medium Comprehension Level: Medium Ability to understand written instructions: Medium Ability to understand verbal instructions: Medium Motivation Anxiety Level: Calm Cooperation: Cooperative Education Importance: Acknowledges Need Interest in Health Problems: Asks Questions Perception: Coherent Willingness to Engage in Self-Management Medium Activities: Readiness to Engage in Self-Management Medium Activities: Electronic Signature(s) Signed: 09/22/2019 4:34:11 PM By: Montey Hora Entered By: Montey Hora on 09/22/2019 08:23:22 Jeremy Le ([id number]) -------------------------------------------------------------------------------- Fall Risk Assessment Details Patient Name: Jeremy Le Date of Service: 09/22/2019 8:00 AM Medical Record Number: [id number] Patient Account Number: [id number] Date of Birth/Sex: 06-25-1922 (86 y.o. M) Treating RN: Montey Hora Primary  Care Taylar Hartsough: Paulita Cradle Other Clinician: Referring Yitzchok Carriger: Paulita Cradle Treating Lorren Splawn/Extender: Melburn Hake, HOYT Weeks in Treatment: 0 Fall Risk Assessment Items Have you had 2 or more falls in the last 12 monthso 0 No Have you had any fall that resulted in injury in the last 12 monthso 0 Yes FALLS RISK SCREEN History of falling - immediate or within 3 months 25 Yes Secondary diagnosis (Do you have 2 or more medical diagnoseso) 0 No Ambulatory aid None/bed rest/wheelchair/nurse 0 No Crutches/cane/walker 15 Yes Furniture 0 No Intravenous therapy Access/Saline/Heparin Lock 0 No Gait/Transferring Normal/ bed rest/ wheelchair 0 No Weak (short steps with or without shuffle, stooped but able  to lift head while walking, may seek 10 Yes support from furniture) Impaired (short steps with shuffle, may have difficulty arising from chair, head down, impaired 0 No balance) Mental Status Oriented to own ability 0 Yes Electronic Signature(s) Signed: 09/22/2019 4:34:11 PM By: Montey Hora Entered By: Montey Hora on 09/22/2019 08:23:48 Jeremy Le ([id number]) -------------------------------------------------------------------------------- Foot Assessment Details Patient Name: Jeremy Le Date of Service: 09/22/2019 8:00 AM Medical Record Number: [id number] Patient Account Number: [id number] Date of Birth/Sex: 1922/04/15 (86 y.o. M) Treating RN: Montey Hora Primary Care Jaleyah Longhi: Paulita Cradle Other Clinician: Referring Lilu Mcglown: Paulita Cradle Treating Kirtan Sada/Extender: Melburn Hake, HOYT Weeks in Treatment: 0 Foot Assessment Items Site Locations + = Sensation present, - = Sensation absent, C = Callus, U = Ulcer R = Redness, W = Warmth, M = Maceration, PU = Pre-ulcerative lesion F = Fissure, S = Swelling, D = Dryness Assessment Right: Left: Other Deformity: No No Prior Foot Ulcer: No No Prior Amputation: No No Charcot Joint: No No Ambulatory Status:  Non-ambulatory Assistance Device: Wheelchair GaitEnergy manager) Signed: 09/22/2019 4:34:11 PM By: Montey Hora Entered By: Montey Hora on 09/22/2019 08:24:13 Jeremy Le ([id number]) -------------------------------------------------------------------------------- Nutrition Risk Screening Details Patient Name: Jeremy Le Date of Service: 09/22/2019 8:00 AM Medical Record Number: [id number] Patient Account Number: [id number] Date of Birth/Sex: 17-Sep-1921 (86 y.o. M) Treating RN: Montey Hora Primary Care Jlen Wintle: Paulita Cradle Other Clinician: Referring Ashutosh Dieguez: Paulita Cradle Treating Kawthar Ennen/Extender: Melburn Hake, HOYT Weeks in Treatment: 0 Height (in): 67 Weight (lbs): 165 Body Mass Index (BMI): 25.8 Nutrition Risk Screening Items Score Screening NUTRITION RISK SCREEN: I have an illness or condition that made me change the kind and/or amount of food I eat 0 No I eat fewer than two meals per day 0 No I eat few fruits and vegetables, or milk products 0 No I have three or more drinks of beer, liquor or wine almost every day 0 No I have tooth or mouth problems that make it hard for me to eat 0 No I don't always have enough money to buy the food I need 0 No I eat alone most of the time 0 No I take three or more different prescribed or over-the-counter drugs a day 1 Yes Without wanting to, I have lost or gained 10 pounds in the last six months 0 No I am not always physically able to shop, cook and/or feed myself 0 No Nutrition Protocols Good Risk Protocol 0 No interventions needed Moderate Risk Protocol High Risk Proctocol Risk Level: Good Risk Score: 1 Electronic Signature(s) Signed: 09/22/2019 4:34:11 PM By: Montey Hora Entered By: Montey Hora on 09/22/2019 08:24:03

## 2019-09-23 NOTE — Progress Notes (Signed)
Jeremy Le, Jeremy Le ([id number]) Visit Report for 09/22/2019 Chief Complaint Document Details Patient Name: Jeremy Le, Jeremy Le Date of Service: 09/22/2019 8:00 AM Medical Record Number: [id number] Patient Account Number: [id number] Date of Birth/Sex: August 03, 1921 (86 y.o. M) Treating RN: Army Melia Primary Care Provider: Paulita Cradle Other Clinician: Referring Provider: Paulita Cradle Treating Provider/Extender: Melburn Hake, HOYT Weeks in Treatment: 0 Information Obtained from: Patient Chief Complaint Left upper arm contusion/skin tear and lower back contusion/hematoma Electronic Signature(s) Signed: 09/22/2019 8:47:01 AM By: Worthy Keeler PA-C Entered By: Worthy Keeler on 09/22/2019 08:47:01 Jeremy Le ([id number]) -------------------------------------------------------------------------------- Debridement Details Patient Name: Jeremy Le Date of Service: 09/22/2019 8:00 AM Medical Record Number: [id number] Patient Account Number: [id number] Date of Birth/Sex: 07/14/1922 (86 y.o. M) Treating RN: Army Melia Primary Care Provider: Paulita Cradle Other Clinician: Referring Provider: Paulita Cradle Treating Provider/Extender: Melburn Hake, HOYT Weeks in Treatment: 0 Debridement Performed for Wound #5 Left Upper Arm Assessment: Performed By: Physician STONE III, HOYT E., PA-C Debridement Type: Chemical/Enzymatic/Mechanical Agent Used: Saline Level of Consciousness (Pre- Awake and Alert procedure): Pre-procedure Verification/Time Out Yes - 08:58 Taken: Start Time: 08:59 Pain Control: Lidocaine Instrument: Other : saline and gauze Bleeding: None End Time: 09:00 Response to Treatment: Procedure was tolerated well Level of Consciousness (Post- Awake and Alert procedure): Post Debridement Measurements of Total Wound Length: (cm) 0.6 Width: (cm) 0.8 Depth: (cm) 0.1 Volume: (cm) 0.038 Character of Wound/Ulcer Post Debridement: Stable Post Procedure Diagnosis Same as  Pre-procedure Electronic Signature(s) Signed: 09/22/2019 3:26:37 PM By: Army Melia Signed: 09/22/2019 5:37:52 PM By: Worthy Keeler PA-C Entered By: Army Melia on 09/22/2019 09:00:27 Jeremy Le ([id number]) -------------------------------------------------------------------------------- Debridement Details Patient Name: Jeremy Le Date of Service: 09/22/2019 8:00 AM Medical Record Number: [id number] Patient Account Number: [id number] Date of Birth/Sex: 1922-01-02 (86 y.o. M) Treating RN: Army Melia Primary Care Provider: Paulita Cradle Other Clinician: Referring Provider: Paulita Cradle Treating Provider/Extender: Melburn Hake, HOYT Weeks in Treatment: 0 Debridement Performed for Wound #6 Midline Back Assessment: Performed By: Physician STONE III, HOYT E., PA-C Debridement Type: Chemical/Enzymatic/Mechanical Agent Used: Saline Level of Consciousness (Pre- Awake and Alert procedure): Pre-procedure Verification/Time Out Yes - 08:58 Taken: Start Time: 08:59 Pain Control: Lidocaine Instrument: Other : saline and gauze Bleeding: None End Time: 09:00 Response to Treatment: Procedure was tolerated well Level of Consciousness (Post- Awake and Alert procedure): Post Debridement Measurements of Total Wound Length: (cm) 1.2 Width: (cm) 1.5 Depth: (cm) 0.1 Volume: (cm) 0.141 Character of Wound/Ulcer Post Debridement: Stable Post Procedure Diagnosis Same as Pre-procedure Electronic Signature(s) Signed: 09/22/2019 3:26:37 PM By: Army Melia Signed: 09/22/2019 5:37:52 PM By: Worthy Keeler PA-C Entered By: Army Melia on 09/22/2019 09:00:49 Jeremy Le ([id number]) -------------------------------------------------------------------------------- HPI Details Patient Name: Jeremy Le Date of Service: 09/22/2019 8:00 AM Medical Record Number: [id number] Patient Account Number: [id number] Date of Birth/Sex: 11-15-1921 (86 y.o. M) Treating RN: Army Melia Primary Care  Provider: Paulita Cradle Other Clinician: Referring Provider: Paulita Cradle Treating Provider/Extender: Melburn Hake, HOYT Weeks in Treatment: 0 History of Present Illness Location: gluteal and natal cleft Quality: Patient reports No Pain. Severity: Patient states wound (s) are getting better. Duration: Patient states that they are not certain how long the wound has been present. Context: The wound appeared gradually over time Modifying Factors: prolonged sitting HPI Description: 09/22/2019 upon evaluation today patient presents for initial inspection here in our clinic concerning issues that he is having with his left upper arm in and around the elbow along with his midline back. At both locations he has very  small ulcerations. The elbow seems to be more of a skin tear in the back is more of a trauma secondary to a fall which has caused a contusion/hematoma in the region which is much more of a concern at this point. The good news is his daughter who is present with him today in the office during the visit states that this is actually improved since that initially started to enlarge. The hematoma has shrunk and it does seem to be that some of the bruising is flowing downward into the lumbosacral and potentially will even end up in the gluteal region. I told her that this is a normal phenomenon and not something that we would be concerned about. With that being said I do think that this is something we want to watch to make sure it does not continue to get larger it sounds like it is gotten smaller that is really what we want to see. Otherwise the patient does have a history of hypertension, diabetes mellitus type 2, and generalized muscle weakness. Electronic Signature(s) Signed: 09/22/2019 5:28:54 PM By: Worthy Keeler PA-C Entered By: Worthy Keeler on 09/22/2019 17:28:54 Jeremy Le ([id number]) -------------------------------------------------------------------------------- Physical  Exam Details Patient Name: Jeremy Le Date of Service: 09/22/2019 8:00 AM Medical Record Number: [id number] Patient Account Number: [id number] Date of Birth/Sex: October 04, 1921 (86 y.o. M) Treating RN: Army Melia Primary Care Provider: Paulita Cradle Other Clinician: Referring Provider: Paulita Cradle Treating Provider/Extender: Melburn Hake, HOYT Weeks in Treatment: 0 Constitutional sitting or standing blood pressure is within target range for patient.. pulse regular and within target range for patient.Marland Kitchen respirations regular, non-labored and within target range for patient.Marland Kitchen temperature within target range for patient.. Well-nourished and well-hydrated in no acute distress. Eyes conjunctiva clear no eyelid edema noted. pupils equal round and reactive to light and accommodation. Ears, Nose, Mouth, and Throat no gross abnormality of ear auricles or external auditory canals. normal hearing noted during conversation. mucus membranes moist. Respiratory normal breathing without difficulty. Cardiovascular no clubbing, cyanosis, significant edema, <3 sec cap refill. Musculoskeletal Patient unable to walk without assistance. Psychiatric Patient is not able to cooperate in decision making regarding care. Patient is oriented to person only. pleasant and cooperative. Notes Upon inspection today patient's elbow/upper arm region actually shows a small skin tear which actually appears to be doing quite well to be honest. Fortunately there is no signs of active infection and overall I feel like this is likely can heal quite well. With regard to the mid to lower back region he unfortunately has a significant area of what appears to be a hematoma underneath the skin and then this is starting to reabsorb and to some degree flow downward so the bruising is spreading. There are several small open areas here although nothing that is very significant at this point which is good news and there is nothing  that opens deeper into the hematoma at this time. Again this is not something that I would recommend opening by any means. That was discussed with the patient's daughter also the patient although he does not aid much in the decision-making. His daughter is the primary caregiver. Electronic Signature(s) Signed: 09/22/2019 5:30:38 PM By: Worthy Keeler PA-C Entered By: Worthy Keeler on 09/22/2019 17:30:38 Jeremy Le ([id number]) -------------------------------------------------------------------------------- Physician Orders Details Patient Name: Jeremy Le Date of Service: 09/22/2019 8:00 AM Medical Record Number: [id number] Patient Account Number: [id number] Date of Birth/Sex: [birth year] (86 y.o. M) Treating RN: Army Melia Primary Care Provider: Paulita Cradle Other Clinician:  Referring Provider: Paulita Cradle Treating Provider/Extender: Melburn Hake, HOYT Weeks in Treatment: 0 Verbal / Phone Orders: No Diagnosis Coding ICD-10 Coding Code Description S41.102A Unspecified open wound of left upper arm, initial encounter S21.209A Unspecified open wound of unspecified back wall of thorax without penetration into thoracic cavity, initial encounter I10 Essential (primary) hypertension E11.622 Type 2 diabetes mellitus with other skin ulcer M62.81 Muscle weakness (generalized) Wound Cleansing Wound #5 Left Upper Arm o Clean wound with Normal Saline. - in office o Dial antibacterial soap, wash wounds, rinse and pat dry prior to dressing wounds Wound #6 Midline Back o Clean wound with Normal Saline. - in office o Dial antibacterial soap, wash wounds, rinse and pat dry prior to dressing wounds Primary Wound Dressing Wound #5 Left Upper Arm o Xeroform Wound #6 Midline Back o Xeroform Secondary Dressing Wound #5 Left Upper Arm o ABD pad - on back, secure with tape o ABD and Kerlix/Conform - on arm, secure with stretch net Wound #6 Midline Back o ABD pad - on  back, secure with tape o ABD and Kerlix/Conform - on arm, secure with stretch net Dressing Change Frequency Wound #5 Left Upper Arm o Change dressing every day. Wound #6 Midline Back o Change dressing every day. Follow-up Appointments Wound #5 Left Upper Arm o Return Appointment in 1 week. Wound #6 Midline Back o Return Appointment in 1 week. Electronic Signature(s) Signed: 09/22/2019 3:26:37 PM By: Ephriam Knuckles ([id number]) Signed: 09/22/2019 5:37:52 PM By: Worthy Keeler PA-C Entered By: Army Melia on 09/22/2019 08:57:46 Jeremy Le ([id number]) -------------------------------------------------------------------------------- Problem List Details Patient Name: Jeremy Le Date of Service: 09/22/2019 8:00 AM Medical Record Number: [id number] Patient Account Number: [id number] Date of Birth/Sex: March 27, 1922 (86 y.o. M) Treating RN: Army Melia Primary Care Provider: Paulita Cradle Other Clinician: Referring Provider: Paulita Cradle Treating Provider/Extender: Melburn Hake, HOYT Weeks in Treatment: 0 Active Problems ICD-10 Evaluated Encounter Code Description Active Date Today Diagnosis S41.102A Unspecified open wound of left upper arm, initial encounter 09/22/2019 No Yes S30.0XXA Contusion of lower back and pelvis, initial encounter 09/22/2019 No Yes S21.209A Unspecified open wound of unspecified back wall of thorax without 09/22/2019 No Yes penetration into thoracic cavity, initial encounter I10 Essential (primary) hypertension 09/22/2019 No Yes E11.622 Type 2 diabetes mellitus with other skin ulcer 09/22/2019 No Yes M62.81 Muscle weakness (generalized) 09/22/2019 No Yes Inactive Problems Resolved Problems Electronic Signature(s) Signed: 09/22/2019 5:28:33 PM By: Worthy Keeler PA-C Previous Signature: 09/22/2019 8:46:10 AM Version By: Worthy Keeler PA-C Previous Signature: 09/22/2019 8:45:38 AM Version By: Worthy Keeler PA-C Entered By: Worthy Keeler on  09/22/2019 17:28:32 Jeremy Le ([id number]) -------------------------------------------------------------------------------- Progress Note Details Patient Name: Jeremy Le Date of Service: 09/22/2019 8:00 AM Medical Record Number: [id number] Patient Account Number: [id number] Date of Birth/Sex: [birth year] (86 y.o. M) Treating RN: Army Melia Primary Care Provider: Paulita Cradle Other Clinician: Referring Provider: Paulita Cradle Treating Provider/Extender: Melburn Hake, HOYT Weeks in Treatment: 0 Subjective Chief Complaint Information obtained from Patient Left upper arm contusion/skin tear and lower back contusion/hematoma History of Present Illness (HPI) The following HPI elements were documented for the patient's wound: Location: gluteal and natal cleft Quality: Patient reports No Pain. Severity: Patient states wound (s) are getting better. Duration: Patient states that they are not certain how long the wound has been present. Context: The wound appeared gradually over time Modifying Factors: prolonged sitting 09/22/2019 upon evaluation today patient presents for initial inspection here in our clinic concerning issues that he is having  with his left upper arm in and around the elbow along with his midline back. At both locations he has very small ulcerations. The elbow seems to be more of a skin tear in the back is more of a trauma secondary to a fall which has caused a contusion/hematoma in the region which is much more of a concern at this point. The good news is his daughter who is present with him today in the office during the visit states that this is actually improved since that initially started to enlarge. The hematoma has shrunk and it does seem to be that some of the bruising is flowing downward into the lumbosacral and potentially will even end up in the gluteal region. I told her that this is a normal phenomenon and not something that we would be concerned about.  With that being said I do think that this is something we want to watch to make sure it does not continue to get larger it sounds like it is gotten smaller that is really what we want to see. Otherwise the patient does have a history of hypertension, diabetes mellitus type 2, and generalized muscle weakness. Patient History Information obtained from Patient. Allergies No Known Allergies Family History Diabetes - Mother, No family history of Cancer, Heart Disease, Hereditary Spherocytosis, Hypertension, Kidney Disease, Lung Disease, Seizures, Stroke, Thyroid Problems, Tuberculosis. Social History Never smoker, Marital Status - Married, Alcohol Use - Never, Drug Use - No History. Medical History Eyes Patient has history of Glaucoma Denies history of Cataracts, Optic Neuritis Ear/Nose/Mouth/Throat Denies history of Chronic sinus problems/congestion, Middle ear problems Hematologic/Lymphatic Patient has history of Anemia - hx Denies history of Hemophilia, Human Immunodeficiency Virus, Lymphedema, Sickle Cell Disease Respiratory Denies history of Aspiration, Asthma, Chronic Obstructive Pulmonary Disease (COPD), Pneumothorax, Sleep Apnea, Tuberculosis Cardiovascular Patient has history of Hypertension Denies history of Angina, Arrhythmia, Congestive Heart Failure, Coronary Artery Disease, Deep Vein Thrombosis, Hypotension, Myocardial Infarction, Peripheral Arterial Disease, Peripheral Venous Disease, Phlebitis, Vasculitis Gastrointestinal Denies history of Cirrhosis , Colitis, Crohn s, Hepatitis A, Hepatitis B, Hepatitis C Endocrine Patient has history of Type II Diabetes Denies history of Type I Diabetes Jeremy Le, Jeremy Le ([id number]) Genitourinary Denies history of End Stage Renal Disease Immunological Denies history of Lupus Erythematosus, Raynaud s, Scleroderma Integumentary (Skin) Denies history of History of Burn, History of pressure wounds Musculoskeletal Denies history of Gout,  Rheumatoid Arthritis, Osteoarthritis, Osteomyelitis Neurologic Denies history of Dementia, Neuropathy, Quadriplegia, Paraplegia, Seizure Disorder Oncologic Denies history of Received Chemotherapy, Received Radiation Psychiatric Denies history of Anorexia/bulimia, Confinement Anxiety Patient is treated with Controlled Diet. Blood sugar is tested. Medical And Surgical History Notes Eyes macular degeneration Oncologic hx prostate cancer Review of Systems (ROS) Eyes Denies complaints or symptoms of Dry Eyes, Vision Changes, Glasses / Contacts. Ear/Nose/Mouth/Throat Denies complaints or symptoms of Difficult clearing ears, Sinusitis. Hematologic/Lymphatic Denies complaints or symptoms of Bleeding / Clotting Disorders, Human Immunodeficiency Virus. Respiratory Denies complaints or symptoms of Chronic or frequent coughs, Shortness of Breath. Cardiovascular Denies complaints or symptoms of Chest pain, LE edema. Gastrointestinal Denies complaints or symptoms of Frequent diarrhea, Nausea, Vomiting. Endocrine Denies complaints or symptoms of Hepatitis, Thyroid disease, Polydypsia (Excessive Thirst). Genitourinary Denies complaints or symptoms of Kidney failure/ Dialysis, Incontinence/dribbling. Immunological Denies complaints or symptoms of Hives, Itching. Integumentary (Skin) Complains or has symptoms of Wounds. Denies complaints or symptoms of Bleeding or bruising tendency, Breakdown, Swelling. Musculoskeletal Denies complaints or symptoms of Muscle Pain, Muscle Weakness. Neurologic Denies complaints or symptoms of Numbness/parasthesias, Focal/Weakness. Psychiatric Denies  complaints or symptoms of Anxiety, Claustrophobia. Objective Constitutional sitting or standing blood pressure is within target range for patient.. pulse regular and within target range for patient.Marland Kitchen respirations regular, non-labored and within target range for patient.Marland Kitchen temperature within target range for  patient.. Well-nourished and well-hydrated in no acute distress. Vitals Time Taken: 8:19 AM, Height: 67 in, Source: Stated, Weight: 165 lbs, Source: Stated, BMI: 25.8, Temperature: 98.7 F, Pulse: 61 bpm, Respiratory Rate: 16 breaths/min, Blood Pressure: 130/44 mmHg. Eyes Jeremy Le, Jeremy Le ([id number]) conjunctiva clear no eyelid edema noted. pupils equal round and reactive to light and accommodation. Ears, Nose, Mouth, and Throat no gross abnormality of ear auricles or external auditory canals. normal hearing noted during conversation. mucus membranes moist. Respiratory normal breathing without difficulty. Cardiovascular no clubbing, cyanosis, significant edema, Musculoskeletal Patient unable to walk without assistance. Psychiatric Patient is not able to cooperate in decision making regarding care. Patient is oriented to person only. pleasant and cooperative. General Notes: Upon inspection today patient's elbow/upper arm region actually shows a small skin tear which actually appears to be doing quite well to be honest. Fortunately there is no signs of active infection and overall I feel like this is likely can heal quite well. With regard to the mid to lower back region he unfortunately has a significant area of what appears to be a hematoma underneath the skin and then this is starting to reabsorb and to some degree flow downward so the bruising is spreading. There are several small open areas here although nothing that is very significant at this point which is good news and there is nothing that opens deeper into the hematoma at this time. Again this is not something that I would recommend opening by any means. That was discussed with the patient's daughter also the patient although he does not aid much in the decision-making. His daughter is the primary caregiver. Integumentary (Hair, Skin) Wound #5 status is Open. Original cause of wound was Trauma. The wound is located on the Left Upper  Arm. The wound measures 0.6cm length x 0.8cm width x 0.1cm depth; 0.377cm^2 area and 0.038cm^3 volume. There is Fat Layer (Subcutaneous Tissue) Exposed exposed. There is no tunneling or undermining noted. There is a medium amount of serous drainage noted. The wound margin is flat and intact. There is small (1-33%) pink granulation within the wound bed. There is a large (67-100%) amount of necrotic tissue within the wound bed including Adherent Slough. Wound #6 status is Open. Original cause of wound was Trauma. The wound is located on the Midline Back. The wound measures 1.2cm length x 1.5cm width x 0.1cm depth; 1.414cm^2 area and 0.141cm^3 volume. There is Fat Layer (Subcutaneous Tissue) Exposed exposed. There is no tunneling or undermining noted. There is a medium amount of serous drainage noted. The wound margin is flat and intact. There is large (67-100%) pink granulation within the wound bed. There is a small (1-33%) amount of necrotic tissue within the wound bed including Adherent Slough. General Notes: patient has a small skin tear in the center of a large hematoma measuring 13 x 10cm Assessment Active Problems ICD-10 Unspecified open wound of left upper arm, initial encounter Contusion of lower back and pelvis, initial encounter Unspecified open wound of unspecified back wall of thorax without penetration into thoracic cavity, initial encounter Essential (primary) hypertension Type 2 diabetes mellitus with other skin ulcer Muscle weakness (generalized) Procedures Wound #5 Pre-procedure diagnosis of Wound #5 is a Skin Tear located on the Left Upper Arm .  There was a Chemical/Enzymatic/Mechanical debridement performed by STONE III, HOYT E., PA-C. With the following instrument(s): saline and gauze after achieving pain control using Lidocaine. Other agent used was Saline. A time out was conducted at 08:58, prior to the start of the procedure. There was no bleeding. The procedure was  tolerated well. Post Debridement Measurements: 0.6cm length x 0.8cm width x 0.1cm depth; 0.038cm^3 volume. Character of Wound/Ulcer Post Debridement is stable. Post procedure Diagnosis Wound #5: Same as Pre-Procedure Wound #6 Pre-procedure diagnosis of Wound #6 is a Trauma, Other located on the Midline Back . There was a Chemical/Enzymatic/Mechanical debridement Jeremy Le, Jeremy Le ([id number]) performed by STONE III, HOYT E., PA-C. With the following instrument(s): saline and gauze after achieving pain control using Lidocaine. Other agent used was Saline. A time out was conducted at 08:58, prior to the start of the procedure. There was no bleeding. The procedure was tolerated well. Post Debridement Measurements: 1.2cm length x 1.5cm width x 0.1cm depth; 0.141cm^3 volume. Character of Wound/Ulcer Post Debridement is stable. Post procedure Diagnosis Wound #6: Same as Pre-Procedure Plan Wound Cleansing: Wound #5 Left Upper Arm: Clean wound with Normal Saline. - in office Dial antibacterial soap, wash wounds, rinse and pat dry prior to dressing wounds Wound #6 Midline Back: Clean wound with Normal Saline. - in office Dial antibacterial soap, wash wounds, rinse and pat dry prior to dressing wounds Primary Wound Dressing: Wound #5 Left Upper Arm: Xeroform Wound #6 Midline Back: Xeroform Secondary Dressing: Wound #5 Left Upper Arm: ABD pad - on back, secure with tape ABD and Kerlix/Conform - on arm, secure with stretch net Wound #6 Midline Back: ABD pad - on back, secure with tape ABD and Kerlix/Conform - on arm, secure with stretch net Dressing Change Frequency: Wound #5 Left Upper Arm: Change dressing every day. Wound #6 Midline Back: Change dressing every day. Follow-up Appointments: Wound #5 Left Upper Arm: Return Appointment in 1 week. Wound #6 Midline Back: Return Appointment in 1 week. 1. My suggestion at this time is good to be that we go ahead and initiate treatment with Xeroform  gauze dressings to both wound locations. I think this is a good thing that will not stick to the wound areas and skin and damage anything but hopefully allow this to prevent infection and subsequently continue with good epithelization. 2. I recommend that we use on the elbow/upper arm region and ABD pad, roll gauze, and stretch netting to secure this in place to keep any adhesive off of his skin. On the back obviously we have to use an adhesive we will use Medipore tape to secure down the ABD pad after applying the Xeroform. I explained to the patient's daughter I think this will be a good tape to use and should not really cause any damage to his skin it seems to have done fairly well for him today when she brought him in that is what she had on and it came off quite easily. 3. I do recommend trying to keep any pressure off of the back region as far as sitting is concerned she may want to look into getting some memory foam from somewhere like [place] to cut it area out the middle of his back so that this can set behind him to take pressure off of the hematoma region. Obviously we do not want this to breakdown and open up into a large wound. 4. I am also going to suggest that the cold compresses are no longer really good to  be necessary or effective at this point hopefully he should not have any continued bleeding and increase in the hematoma size that we did measure that we will continue to do so in order to follow and make sure that this resolves as much as possible and to we are no longer seeing the patient. We will see patient back for reevaluation in 1 week here in the clinic. If anything worsens or changes patient will contact our office for additional recommendations. Electronic Signature(s) Signed: 09/22/2019 5:32:47 PM By: Worthy Keeler PA-C Entered By: Worthy Keeler on 09/22/2019 17:32:47 Jeremy Le  ([id number]) -------------------------------------------------------------------------------- ROS/PFSH Details Patient Name: Jeremy Le Date of Service: 09/22/2019 8:00 AM Medical Record Number: [id number] Patient Account Number: [id number] Date of Birth/Sex: [birth year] (86 y.o. M) Treating RN: Montey Hora Primary Care Provider: Paulita Cradle Other Clinician: Referring Provider: Paulita Cradle Treating Provider/Extender: Melburn Hake, HOYT Weeks in Treatment: 0 Information Obtained From Patient Eyes Complaints and Symptoms: Negative for: Dry Eyes; Vision Changes; Glasses / Contacts Medical History: Positive for: Glaucoma Negative for: Cataracts; Optic Neuritis Past Medical History Notes: macular degeneration Ear/Nose/Mouth/Throat Complaints and Symptoms: Negative for: Difficult clearing ears; Sinusitis Medical History: Negative for: Chronic sinus problems/congestion; Middle ear problems Hematologic/Lymphatic Complaints and Symptoms: Negative for: Bleeding / Clotting Disorders; Human Immunodeficiency Virus Medical History: Positive for: Anemia - hx Negative for: Hemophilia; Human Immunodeficiency Virus; Lymphedema; Sickle Cell Disease Respiratory Complaints and Symptoms: Negative for: Chronic or frequent coughs; Shortness of Breath Medical History: Negative for: Aspiration; Asthma; Chronic Obstructive Pulmonary Disease (COPD); Pneumothorax; Sleep Apnea; Tuberculosis Cardiovascular Complaints and Symptoms: Negative for: Chest pain; LE edema Medical History: Positive for: Hypertension Negative for: Angina; Arrhythmia; Congestive Heart Failure; Coronary Artery Disease; Deep Vein Thrombosis; Hypotension; Myocardial Infarction; Peripheral Arterial Disease; Peripheral Venous Disease; Phlebitis; Vasculitis Gastrointestinal Complaints and Symptoms: Negative for: Frequent diarrhea; Nausea; Vomiting Medical History: Negative for: Cirrhosis ; Colitis; Crohnos; Hepatitis A;  Hepatitis B; Hepatitis C Endocrine Complaints and Symptoms: Negative for: Hepatitis; Thyroid disease; Polydypsia (Excessive Thirst) LINZIE, Jeremy Le ([id number]) Medical History: Positive for: Type II Diabetes Negative for: Type I Diabetes Time with diabetes: 11 years Treated with: Diet Blood sugar tested every day: Yes Tested : Q PM Genitourinary Complaints and Symptoms: Negative for: Kidney failure/ Dialysis; Incontinence/dribbling Medical History: Negative for: End Stage Renal Disease Immunological Complaints and Symptoms: Negative for: Hives; Itching Medical History: Negative for: Lupus Erythematosus; Raynaudos; Scleroderma Integumentary (Skin) Complaints and Symptoms: Positive for: Wounds Negative for: Bleeding or bruising tendency; Breakdown; Swelling Medical History: Negative for: History of Burn; History of pressure wounds Musculoskeletal Complaints and Symptoms: Negative for: Muscle Pain; Muscle Weakness Medical History: Negative for: Gout; Rheumatoid Arthritis; Osteoarthritis; Osteomyelitis Neurologic Complaints and Symptoms: Negative for: Numbness/parasthesias; Focal/Weakness Medical History: Negative for: Dementia; Neuropathy; Quadriplegia; Paraplegia; Seizure Disorder Psychiatric Complaints and Symptoms: Negative for: Anxiety; Claustrophobia Medical History: Negative for: Anorexia/bulimia; Confinement Anxiety Oncologic Medical History: Negative for: Received Chemotherapy; Received Radiation Past Medical History Notes: hx prostate cancer HBO Extended History Items Eyes: Glaucoma Jeremy Le, Jeremy Le ([id number]) Immunizations Pneumococcal Vaccine: Received Pneumococcal Vaccination: Yes Immunization Notes: up to date Implantable Devices None Family and Social History Cancer: No; Diabetes: Yes - Mother; Heart Disease: No; Hereditary Spherocytosis: No; Hypertension: No; Kidney Disease: No; Lung Disease: No; Seizures: No; Stroke: No; Thyroid Problems: No;  Tuberculosis: No; Never smoker; Marital Status - Married; Alcohol Use: Never; Drug Use: No History; Financial Concerns: No; Food, Clothing or Shelter Needs: No; Support System Lacking: No; Transportation Concerns: No Electronic Signature(s) Signed: 09/22/2019 4:34:11 PM By:  Montey Hora Signed: 09/22/2019 5:37:52 PM By: Worthy Keeler PA-C Entered By: Montey Hora on 09/22/2019 08:22:00 Jeremy Le ([id number]) -------------------------------------------------------------------------------- SuperBill Details Patient Name: Jeremy Le Date of Service: 09/22/2019 Medical Record Number: [id number] Patient Account Number: [id number] Date of Birth/Sex: 1921-11-19 (86 y.o. M) Treating RN: Army Melia Primary Care Provider: Paulita Cradle Other Clinician: Referring Provider: Paulita Cradle Treating Provider/Extender: Melburn Hake, HOYT Weeks in Treatment: 0 Diagnosis Coding ICD-10 Codes Code Description S41.102A Unspecified open wound of left upper arm, initial encounter S30.0XXA Contusion of lower back and pelvis, initial encounter S21.209A Unspecified open wound of unspecified back wall of thorax without penetration into thoracic cavity, initial encounter I10 Essential (primary) hypertension E11.622 Type 2 diabetes mellitus with other skin ulcer M62.81 Muscle weakness (generalized) Facility Procedures CPT4 Code: [id number] Description: 99214 - WOUND CARE VISIT-LEV 4 EST PT Modifier: Quantity: 1 Physician Procedures CPT4: Description Modifier Quantity Code [id number] WC PHYS LEVEL 3 o NEW PT 1 CPT4: ICD-10 Diagnosis Description [id number] Unspecified open wound of left upper arm, initial encounter S30.0XXA Contusion of lower back and pelvis, initial encounter S21.209A Unspecified open wound of unspecified back wall of thorax without penetration  into thoracic cavity, initial encounter M62.81 Muscle weakness (generalized) Electronic Signature(s) Signed: 09/22/2019 5:33:30 PM By: Worthy Keeler PA-C Entered By: Worthy Keeler on 09/22/2019 17:33:30

## 2019-09-29 ENCOUNTER — Encounter: Payer: Medicare Other | Admitting: Physician Assistant

## 2019-09-29 ENCOUNTER — Other Ambulatory Visit: Payer: Self-pay

## 2019-09-29 DIAGNOSIS — S40022A Contusion of left upper arm, initial encounter: Secondary | ICD-10-CM | POA: Diagnosis not present

## 2019-09-29 NOTE — Progress Notes (Addendum)
Jeremy Le, Jeremy Le ([id number]) Visit Report for 09/29/2019 Chief Complaint Document Details Patient Name: Jeremy Le, Jeremy Le Date of Service: 09/29/2019 8:00 AM Medical Record Number: [id number] Patient Account Number: [id number] Date of Birth/Sex: Sep 28, 1921 (86 y.o. M) Treating RN: Army Melia Primary Care Provider: Paulita Cradle Other Clinician: Referring Provider: Paulita Cradle Treating Provider/Extender: Melburn Hake, Kipling Graser Weeks in Treatment: 1 Information Obtained from: Patient Chief Complaint Left upper arm contusion/skin tear and lower back contusion/hematoma Electronic Signature(s) Signed: 09/29/2019 8:20:08 AM By: Worthy Keeler PA-C Entered By: Worthy Keeler on 09/29/2019 08:20:08 Jeremy Le ([id number]) -------------------------------------------------------------------------------- HPI Details Patient Name: Jeremy Le Date of Service: 09/29/2019 8:00 AM Medical Record Number: [id number] Patient Account Number: [id number] Date of Birth/Sex: [birth year] (86 y.o. M) Treating RN: Army Melia Primary Care Provider: Paulita Cradle Other Clinician: Referring Provider: Paulita Cradle Treating Provider/Extender: Melburn Hake, Aylin Rhoads Weeks in Treatment: 1 History of Present Illness HPI Description: 09/22/2019 upon evaluation today patient presents for initial inspection here in our clinic concerning issues that he is having with his left upper arm in and around the elbow along with his midline back. At both locations he has very small ulcerations. The elbow seems to be more of a skin tear in the back is more of a trauma secondary to a fall which has caused a contusion/hematoma in the region which is much more of a concern at this point. The good news is his daughter who is present with him today in the office during the visit states that this is actually improved since that initially started to enlarge. The hematoma has shrunk and it does seem to be that some of the bruising is  flowing downward into the lumbosacral and potentially will even end up in the gluteal region. I told her that this is a normal phenomenon and not something that we would be concerned about. With that being said I do think that this is something we want to watch to make sure it does not continue to get larger it sounds like it is gotten smaller that is really what we want to see. Otherwise the patient does have a history of hypertension, diabetes mellitus type 2, and generalized muscle weakness. 09/29/2019 on evaluation today the patient actually appears to be doing excellent. His elbow is really appearing healed today which is great news overall. The midline back is also doing better which is also great news. In fact I am not even sure this is open that we will likely monitor this for 1 more week. Fortunately there is no signs of infection at this time which is good news. In fact I am not even sure anything is open on the back other than I will likely watch this 1 more week just due to the hematoma. Electronic Signature(s) Signed: 09/29/2019 10:22:37 AM By: Worthy Keeler PA-C Entered By: Worthy Keeler on 09/29/2019 10:22:37 Jeremy Le ([id number]) -------------------------------------------------------------------------------- Physical Exam Details Patient Name: Jeremy Le Date of Service: 09/29/2019 8:00 AM Medical Record Number: [id number] Patient Account Number: [id number] Date of Birth/Sex: 19-Aug-1921 (86 y.o. M) Treating RN: Army Melia Primary Care Provider: Paulita Cradle Other Clinician: Referring Provider: Paulita Cradle Treating Provider/Extender: STONE III, Khyran Riera Weeks in Treatment: 1 Constitutional Well-nourished and well-hydrated in no acute distress. Respiratory normal breathing without difficulty. Psychiatric Patient is not able to cooperate in decision making regarding care. Patient is oriented to person only. pleasant and cooperative. Notes Based on what I am  seeing currently I am to go ahead and discuss with the patient's  daughter that we probably just 1 to monitor the back area we can use some Vaseline over that region as well as the elbow this seems to have done well. With that being said I do not think that likely anything is going open in regard to the back and by next week we should probably be able to discharge him is healed without any further complications or issues. Electronic Signature(s) Signed: 09/29/2019 10:23:14 AM By: Worthy Keeler PA-C Entered By: Worthy Keeler on 09/29/2019 10:23:14 Jeremy Le ([id number]) -------------------------------------------------------------------------------- Physician Orders Details Patient Name: Jeremy Le Date of Service: 09/29/2019 8:00 AM Medical Record Number: [id number] Patient Account Number: [id number] Date of Birth/Sex: 1922/02/20 (86 y.o. M) Treating RN: Army Melia Primary Care Provider: Paulita Cradle Other Clinician: Referring Provider: Paulita Cradle Treating Provider/Extender: Melburn Hake, Corie Allis Weeks in Treatment: 1 Verbal / Phone Orders: No Diagnosis Coding ICD-10 Coding Code Description S41.102A Unspecified open wound of left upper arm, initial encounter S30.0XXA Contusion of lower back and pelvis, initial encounter S21.209A Unspecified open wound of unspecified back wall of thorax without penetration into thoracic cavity, initial encounter I10 Essential (primary) hypertension E11.622 Type 2 diabetes mellitus with other skin ulcer M62.81 Muscle weakness (generalized) Wound Cleansing Wound #6 Midline Back o Clean wound with Normal Saline. - in office o Dial antibacterial soap, wash wounds, rinse and pat dry prior to dressing wounds Skin Barriers/Peri-Wound Care Wound #6 Midline Back o Triamcinolone Acetonide Ointment (TCA) Secondary Dressing Wound #6 Midline Back o ABD pad - on back, secure with tape Dressing Change Frequency Wound #6 Midline Back o  Change dressing every day. Follow-up Appointments Wound #6 Midline Back o Return Appointment in 1 week. Electronic Signature(s) Signed: 09/29/2019 9:13:28 AM By: Army Melia Signed: 09/30/2019 4:19:26 PM By: Worthy Keeler PA-C Entered By: Army Melia on 09/29/2019 08:29:48 Jeremy Le ([id number]) -------------------------------------------------------------------------------- Problem List Details Patient Name: Jeremy Le Date of Service: 09/29/2019 8:00 AM Medical Record Number: [id number] Patient Account Number: [id number] Date of Birth/Sex: December 21, 1921 (86 y.o. M) Treating RN: Army Melia Primary Care Provider: Paulita Cradle Other Clinician: Referring Provider: Paulita Cradle Treating Provider/Extender: Melburn Hake, Eniyah Eastmond Weeks in Treatment: 1 Active Problems ICD-10 Evaluated Encounter Code Description Active Date Today Diagnosis S41.102A Unspecified open wound of left upper arm, initial encounter 09/22/2019 No Yes S30.0XXA Contusion of lower back and pelvis, initial encounter 09/22/2019 No Yes S21.209A Unspecified open wound of unspecified back wall of thorax without 09/22/2019 No Yes penetration into thoracic cavity, initial encounter I10 Essential (primary) hypertension 09/22/2019 No Yes E11.622 Type 2 diabetes mellitus with other skin ulcer 09/22/2019 No Yes M62.81 Muscle weakness (generalized) 09/22/2019 No Yes Inactive Problems Resolved Problems Electronic Signature(s) Signed: 09/29/2019 8:19:59 AM By: Worthy Keeler PA-C Entered By: Worthy Keeler on 09/29/2019 08:19:59 Jeremy Le ([id number]) -------------------------------------------------------------------------------- Progress Note Details Patient Name: Jeremy Le Date of Service: 09/29/2019 8:00 AM Medical Record Number: [id number] Patient Account Number: [id number] Date of Birth/Sex: [birth year] (86 y.o. M) Treating RN: Army Melia Primary Care Provider: Paulita Cradle Other Clinician: Referring  Provider: Paulita Cradle Treating Provider/Extender: Melburn Hake, Nasirah Sachs Weeks in Treatment: 1 Subjective Chief Complaint Information obtained from Patient Left upper arm contusion/skin tear and lower back contusion/hematoma History of Present Illness (HPI) 09/22/2019 upon evaluation today patient presents for initial inspection here in our clinic concerning issues that he is having with his left upper arm in and around the elbow along with his midline back. At both locations he has very small ulcerations. The elbow seems to be  more of a skin tear in the back is more of a trauma secondary to a fall which has caused a contusion/hematoma in the region which is much more of a concern at this point. The good news is his daughter who is present with him today in the office during the visit states that this is actually improved since that initially started to enlarge. The hematoma has shrunk and it does seem to be that some of the bruising is flowing downward into the lumbosacral and potentially will even end up in the gluteal region. I told her that this is a normal phenomenon and not something that we would be concerned about. With that being said I do think that this is something we want to watch to make sure it does not continue to get larger it sounds like it is gotten smaller that is really what we want to see. Otherwise the patient does have a history of hypertension, diabetes mellitus type 2, and generalized muscle weakness. 09/29/2019 on evaluation today the patient actually appears to be doing excellent. His elbow is really appearing healed today which is great news overall. The midline back is also doing better which is also great news. In fact I am not even sure this is open that we will likely monitor this for 1 more week. Fortunately there is no signs of infection at this time which is good news. In fact I am not even sure anything is open on the back other than I will likely watch this 1  more week just due to the hematoma. Objective Constitutional Well-nourished and well-hydrated in no acute distress. Vitals Time Taken: 8:15 AM, Height: 67 in, Weight: 165 lbs, BMI: 25.8, Temperature: 98.6 F, Pulse: 61 bpm, Respiratory Rate: 16 breaths/min, Blood Pressure: 105/48 mmHg. Respiratory normal breathing without difficulty. Psychiatric Patient is not able to cooperate in decision making regarding care. Patient is oriented to person only. pleasant and cooperative. General Notes: Based on what I am seeing currently I am to go ahead and discuss with the patient's daughter that we probably just 1 to monitor the back area we can use some Vaseline over that region as well as the elbow this seems to have done well. With that being said I do not think that likely anything is going open in regard to the back and by next week we should probably be able to discharge him is healed without any further complications or issues. Integumentary (Hair, Skin) Wound #5 status is Healed - Epithelialized. Original cause of wound was Trauma. The wound is located on the Left Upper Arm. The wound measures 0cm length x 0cm width x 0cm depth; 0cm^2 area and 0cm^3 volume. There is Fat Layer (Subcutaneous Tissue) Exposed exposed. There is no tunneling or undermining noted. There is a small amount of serous drainage noted. The wound margin is flat and intact. There is large (67-100%) pink granulation within the wound bed. There is no necrotic tissue within the wound bed. Wound #6 status is Open. Original cause of wound was Trauma. The wound is located on the Midline Back. The wound measures 0.1cm length x 0.1cm width x 0.1cm depth; 0.008cm^2 area and 0.001cm^3 volume. The wound is limited to skin breakdown. There is no tunneling or undermining noted. There is a none present amount of drainage noted. The wound margin is flat and intact. There is no granulation within the wound bed. There is Jeremy Le, Jeremy Le  ([id number]) no necrotic tissue within the wound bed. General Notes: hematoma  measures 12.5x9.5 cm Assessment Active Problems ICD-10 Unspecified open wound of left upper arm, initial encounter Contusion of lower back and pelvis, initial encounter Unspecified open wound of unspecified back wall of thorax without penetration into thoracic cavity, initial encounter Essential (primary) hypertension Type 2 diabetes mellitus with other skin ulcer Muscle weakness (generalized) Plan Wound Cleansing: Wound #6 Midline Back: Clean wound with Normal Saline. - in office Dial antibacterial soap, wash wounds, rinse and pat dry prior to dressing wounds Skin Barriers/Peri-Wound Care: Wound #6 Midline Back: Triamcinolone Acetonide Ointment (TCA) Secondary Dressing: Wound #6 Midline Back: ABD pad - on back, secure with tape Dressing Change Frequency: Wound #6 Midline Back: Change dressing every day. Follow-up Appointments: Wound #6 Midline Back: Return Appointment in 1 week. 1. My suggestion currently is can be that we continue with Vaseline followed by just a protective dressing to both locations that in regard to the elbow is healed in regard to the back may be healed but again we will monitoring this for just 1 more week to make sure. 2. I am also can recommend continued monitoring as far as any pressure getting to either area and also trauma we want to keep something on it for covering and protection otherwise for this reason. 3. I am also can recommend that we continue to monitor the hematoma itself for 1 more week if it indeed is healed and there is nothing open or draining come next week in regard to the back then we will discharge the patient from services that day. We will see patient back for reevaluation in 1 week here in the clinic. If anything worsens or changes patient will contact our office for additional recommendations. Electronic Signature(s) Signed: 09/29/2019 10:24:05 AM By:  Worthy Keeler PA-C Entered By: Worthy Keeler on 09/29/2019 10:24:04 Jeremy Le ([id number]) -------------------------------------------------------------------------------- SuperBill Details Patient Name: Jeremy Le Date of Service: 09/29/2019 Medical Record Number: [id number] Patient Account Number: [id number] Date of Birth/Sex: Jun 17, 1922 (86 y.o. M) Treating RN: Army Melia Primary Care Provider: Paulita Cradle Other Clinician: Referring Provider: Paulita Cradle Treating Provider/Extender: Melburn Hake, Calvert Charland Weeks in Treatment: 1 Diagnosis Coding ICD-10 Codes Code Description S41.102A Unspecified open wound of left upper arm, initial encounter S30.0XXA Contusion of lower back and pelvis, initial encounter S21.209A Unspecified open wound of unspecified back wall of thorax without penetration into thoracic cavity, initial encounter I10 Essential (primary) hypertension E11.622 Type 2 diabetes mellitus with other skin ulcer M62.81 Muscle weakness (generalized) Facility Procedures CPT4 Code: [id number] Description: 99213 - WOUND CARE VISIT-LEV 3 EST PT Modifier: Quantity: 1 Physician Procedures CPT4: Description Modifier Quantity Code [id number] 99213 - WC PHYS LEVEL 3 - EST PT 1 CPT4: ICD-10 Diagnosis Description [id number] Unspecified open wound of left upper arm, initial encounter S30.0XXA Contusion of lower back and pelvis, initial encounter S21.209A Unspecified open wound of unspecified back wall of thorax without penetration  into thoracic cavity, initial encounter I10 Essential (primary) hypertension Electronic Signature(s) Signed: 09/29/2019 10:24:21 AM By: Worthy Keeler PA-C Entered By: Worthy Keeler on 09/29/2019 10:24:20

## 2019-09-29 NOTE — Progress Notes (Signed)
INRI, GUNION ([id number]) Visit Report for 09/29/2019 Arrival Information Details Patient Name: Jeremy Le, Jeremy Le Date of Service: 09/29/2019 8:00 AM Medical Record Number: [id number] Patient Account Number: [id number] Date of Birth/Sex: 1921-11-28 (86 y.o. M) Treating RN: Jeremy Le Primary Care Jeremy Le: Jeremy Le Other Clinician: Referring Jeremy Le: Jeremy Le Treating Jeremy Le/Extender: Jeremy Le, Jeremy Jeremy Le: 1 Visit Information History Since Last Visit Added or deleted any medications: No Patient Arrived: Wheel Chair Any new allergies or adverse reactions: No Arrival Time: 08:13 Had a fall or experienced change in No Accompanied By: daughter activities of daily living that may affect Transfer Assistance: None risk of falls: Patient Identification Verified: Yes Signs or symptoms of abuse/neglect since last visito No Secondary Verification Process Completed: Yes Hospitalized since last visit: No Implantable device outside of the clinic excluding No cellular tissue based products placed in the center since last visit: Has Dressing in Place as Prescribed: Yes Pain Present Now: No Electronic Signature(s) Signed: 09/29/2019 4:05:00 PM By: Jeremy Le Entered By: Jeremy Le on 09/29/2019 08:14:35 Jeremy Le ([id number]) -------------------------------------------------------------------------------- Clinic Level of Care Assessment Details Patient Name: Jeremy Le Date of Service: 09/29/2019 8:00 AM Medical Record Number: [id number] Patient Account Number: [id number] Date of Birth/Sex: 05-04-1922 (86 y.o. M) Treating RN: Jeremy Le Primary Care Jeremy Le: Jeremy Le Other Clinician: Referring Jeremy Le: Jeremy Le Treating Jeremy Le/Extender: Jeremy Le, Jeremy Jeremy Le: 1 Clinic Level of Care Assessment Items TOOL 4 Quantity Score []  - Use when only an EandM is performed on FOLLOW-UP visit 0 ASSESSMENTS - Nursing  Assessment / Reassessment X - Reassessment of Co-morbidities (includes updates in patient status) 1 10 X- 1 5 Reassessment of Adherence to [place] ASSESSMENTS - Wound and Skin Assessment / Reassessment []  - Simple Wound Assessment / Reassessment - one wound 0 X- 2 5 Complex Wound Assessment / Reassessment - multiple wounds []  - 0 Dermatologic / Skin Assessment (not related to wound area) ASSESSMENTS - Focused Assessment []  - Circumferential Edema Measurements - multi extremities 0 []  - 0 Nutritional Assessment / Counseling / Intervention []  - 0 Lower Extremity Assessment (monofilament, tuning fork, pulses) []  - 0 Peripheral Arterial Disease Assessment (using hand held doppler) ASSESSMENTS - Ostomy and/or Continence Assessment and Care []  - Incontinence Assessment and Management 0 []  - 0 Ostomy Care Assessment and Management (repouching, etc.) PROCESS - Coordination of Care X - Simple Patient / Family Education for ongoing care 1 15 []  - 0 Complex (extensive) Patient / Family Education for ongoing care []  - 0 Staff obtains Programmer, systems, Records, Test Results / Process Orders []  - 0 Staff telephones HHA, Nursing Homes / Clarify orders / etc []  - 0 Routine Transfer to another Facility (non-emergent condition) []  - 0 Routine Hospital Admission (non-emergent condition) []  - 0 New Admissions / Biomedical engineer / Ordering NPWT, Apligraf, etc. []  - 0 Emergency Hospital Admission (emergent condition) X- 1 10 Simple Discharge Coordination []  - 0 Complex (extensive) Discharge Coordination PROCESS - Special Needs []  - Pediatric / Minor Patient Management 0 []  - 0 Isolation Patient Management []  - 0 Hearing / Language / Visual special needs []  - 0 Assessment of Community assistance (transportation, D/C planning, etc.) Jeremy Le, Jeremy Le ([id number]) []  - 0 Additional assistance / Altered mentation []  - 0 Support Surface(s) Assessment (bed, cushion, seat,  etc.) INTERVENTIONS - Wound Cleansing / Measurement []  - Simple Wound Cleansing - one wound 0 X- 2 5 Complex Wound Cleansing - multiple wounds X- 1 5 Wound Imaging (photographs - any number of wounds) []  -  0 Wound Tracing (instead of photographs) []  - 0 Simple Wound Measurement - one wound X- 2 5 Complex Wound Measurement - multiple wounds INTERVENTIONS - Wound Dressings []  - Small Wound Dressing one or multiple wounds 0 X- 1 15 Medium Wound Dressing one or multiple wounds []  - 0 Large Wound Dressing one or multiple wounds []  - 0 Application of Medications - topical []  - 0 Application of Medications - injection INTERVENTIONS - Miscellaneous []  - External ear exam 0 []  - 0 Specimen Collection (cultures, biopsies, blood, body fluids, etc.) []  - 0 Specimen(s) / Culture(s) sent or taken to Lab for analysis []  - 0 Patient Transfer (multiple staff / Civil Service fast streamer / Similar devices) []  - 0 Simple Staple / Suture removal (25 or less) []  - 0 Complex Staple / Suture removal (26 or more) []  - 0 Hypo / Hyperglycemic Management (close monitor of Blood Glucose) []  - 0 Ankle / Brachial Index (ABI) - do not check if billed separately X- 1 5 Vital Signs Has the patient been seen at the hospital within the last three years: Yes Total Score: 95 Level Of Care: New/Established - Level 3 Electronic Signature(s) Signed: 09/29/2019 9:13:28 AM By: Jeremy Le Entered By: Jeremy Le on 09/29/2019 08:30:34 Jeremy Le ([id number]) -------------------------------------------------------------------------------- Encounter Discharge Information Details Patient Name: Jeremy Le Date of Service: 09/29/2019 8:00 AM Medical Record Number: [id number] Patient Account Number: [id number] Date of Birth/Sex: January 25, 1922 (86 y.o. M) Treating RN: Jeremy Le Primary Care Jeremy Le: Jeremy Le Other Clinician: Referring Jeremy Le: Jeremy Le Treating Jeremy Le/Extender: Jeremy Le,  Jeremy Jeremy Le: 1 Encounter Discharge Information Items Discharge Condition: Stable Ambulatory Status: Wheelchair Discharge Destination: Home Transportation: Private Auto Accompanied By: daughter Schedule Follow-up Appointment: Yes Clinical Summary of Care: Electronic Signature(s) Signed: 09/29/2019 9:13:28 AM By: Jeremy Le Entered By: Jeremy Le on 09/29/2019 08:31:29 Jeremy Le ([id number]) -------------------------------------------------------------------------------- Lower Extremity Assessment Details Patient Name: Jeremy Le Date of Service: 09/29/2019 8:00 AM Medical Record Number: [id number] Patient Account Number: [id number] Date of Birth/Sex: Dec 12, 1921 (86 y.o. M) Treating RN: Jeremy Le Primary Care Dorismar Chay: Jeremy Le Other Clinician: Referring Amarian Botero: Jeremy Le Treating Amillion Macchia/Extender: Jeremy Le, Jeremy Jeremy Le: 1 Electronic Signature(s) Signed: 09/29/2019 4:05:00 PM By: Jeremy Le Entered By: Jeremy Le on 09/29/2019 08:15:04 Jeremy Le ([id number]) -------------------------------------------------------------------------------- Multi Wound Chart Details Patient Name: Jeremy Le Date of Service: 09/29/2019 8:00 AM Medical Record Number: [id number] Patient Account Number: [id number] Date of Birth/Sex: Aug 04, 1921 (86 y.o. M) Treating RN: Jeremy Le Primary Care Matricia Begnaud: Jeremy Le Other Clinician: Referring Julena Barbour: Jeremy Le Treating Deysi Soldo/Extender: Jeremy Le, Jeremy Jeremy Le: 1 Vital Signs Height(in): 68 Pulse(bpm): 11 Weight(lbs): 165 Blood Pressure(mmHg): 105/48 Body Mass Index(BMI): 26 Temperature(F): 98.6 Respiratory Rate(breaths/min): 16 Photos: [5:No Photos] [6:No Photos] [N/A:N/A] Wound Location: [5:Left Upper Arm] [6:Back - Midline] [N/A:N/A] Wounding Event: [5:Trauma] [6:Trauma] [N/A:N/A] Primary Etiology: [5:Skin Tear] [6:Trauma, Other]  [N/A:N/A] Comorbid History: [5:Glaucoma, Anemia, Hypertension, Type II Diabetes] [6:Glaucoma, Anemia, Hypertension, Type II Diabetes] [N/A:N/A] Date Acquired: [5:09/16/2019] [6:09/16/2019] [N/A:N/A] Le of Le: [5:1] [6:1] [N/A:N/A] Wound Status: [5:Open] [6:Open] [N/A:N/A] Measurements L x W x D (cm) [5:0.4x0.7x0.1] [6:0.1x0.1x0.1] [N/A:N/A] Area (cm) : [5:0.22] [id number] [N/A:N/A] Volume (cm) : [5:0.022] [6:0.001] [N/A:N/A] % Reduction in Area: [5:41.60%] [6:99.40%] [N/A:N/A] % Reduction in Volume: [5:42.10%] [6:99.30%] [N/A:N/A] Classification: [5:Full Thickness Without Exposed Support Structures] [6:Full Thickness Without Exposed Support Structures] [N/A:N/A] Exudate Amount: [5:Small] [6:None Present] [N/A:N/A] Exudate Type: [5:Serous] [6:N/A] [N/A:N/A] Exudate Color: [5:amber] [6:N/A] [N/A:N/A] Wound Margin: [5:Flat and Intact] [6:Flat and Intact] [N/A:N/A] Granulation  Amount: [5:Large (67-100%)] [6:None Present (0%)] [N/A:N/A] Granulation Quality: [5:Pink] [6:N/A] [N/A:N/A] Necrotic Amount: [5:None Present (0%)] [6:None Present (0%)] [N/A:N/A] Exposed Structures: [5:Fat Layer (Subcutaneous Tissue) Exposed: Yes Fascia: No Tendon: No Muscle: No Joint: No Bone: No Small (1-33%)] [6:Fascia: No Fat Layer (Subcutaneous Tissue) Exposed: No Tendon: No Muscle: No Joint: No Bone: No Limited to Skin Breakdown Large  (67-100%)] [N/A:N/A N/A] Le Notes Electronic Signature(s) Signed: 09/29/2019 9:13:28 AM By: Jeremy Le Entered By: Jeremy Le on 09/29/2019 08:26:22 Jeremy Le ([id number]) -------------------------------------------------------------------------------- Multi-Disciplinary Care Plan Details Patient Name: Jeremy Le Date of Service: 09/29/2019 8:00 AM Medical Record Number: [id number] Patient Account Number: [id number] Date of Birth/Sex: April 13, 1922 (86 y.o. M) Treating RN: Jeremy Le Primary Care Samariah Hokenson: Jeremy Le Other Clinician: Referring  River Ambrosio: Jeremy Le Treating Hani Campusano/Extender: Jeremy Le, Jeremy Jeremy Le: 1 Active Inactive Abuse / Safety / Falls / Self Care Management Nursing Diagnoses: History of Falls Goals: Patient/caregiver will identify factors that restrict self-care and home management Date Initiated: 09/22/2019 Target Resolution Date: 10/28/2019 Goal Status: Active Interventions: Provide education on fall prevention Notes: Orientation to the Wound Care Program Nursing Diagnoses: Knowledge deficit related to the wound healing center program Goals: Patient/caregiver will verbalize understanding of the [place] Program Date Initiated: 09/22/2019 Target Resolution Date: 10/28/2019 Goal Status: Active Interventions: Provide education on orientation to the wound center Notes: Wound/Skin Impairment Nursing Diagnoses: Impaired tissue integrity Goals: Ulcer/skin breakdown will have a volume reduction of 30% by week 4 Date Initiated: 09/22/2019 Target Resolution Date: 10/28/2019 Goal Status: Active Interventions: Assess ulceration(s) every visit Notes: Electronic Signature(s) Signed: 09/29/2019 9:13:28 AM By: Jeremy Le Entered By: Jeremy Le on 09/29/2019 08:26:15 Jeremy Le ([id number]) Jeremy Le ([id number]) -------------------------------------------------------------------------------- Pain Assessment Details Patient Name: Jeremy Le Date of Service: 09/29/2019 8:00 AM Medical Record Number: [id number] Patient Account Number: [id number] Date of Birth/Sex: Jun 12, 1922 (86 y.o. M) Treating RN: Jeremy Le Primary Care Charnita Trudel: Jeremy Le Other Clinician: Referring Aeris Hersman: Jeremy Le Treating Bernerd Terhune/Extender: Jeremy Le, Jeremy Jeremy Le: 1 Active Problems Location of Pain Severity and Description of Pain Patient Has Paino No Site Locations Pain Management and Medication Current Pain Management: Electronic Signature(s) Signed:  09/29/2019 4:05:00 PM By: Jeremy Le Entered By: Jeremy Le on 09/29/2019 08:14:56 Jeremy Le ([id number]) -------------------------------------------------------------------------------- Patient/Caregiver Education Details Patient Name: Jeremy Le Date of Service: 09/29/2019 8:00 AM Medical Record Number: [id number] Patient Account Number: [id number] Date of Birth/Gender: 1921/08/10 (86 y.o. M) Treating RN: Jeremy Le Primary Care Physician: Jeremy Le Other Clinician: Referring Physician: Paulita Le Treating Physician/Extender: Sharalyn Ink in Le: 1 Education Assessment Education Provided To: Patient Education Topics Provided Wound/Skin Impairment: Handouts: Caring for Your Ulcer Methods: Demonstration, Explain/Verbal Responses: State content correctly Electronic Signature(s) Signed: 09/29/2019 9:13:28 AM By: Jeremy Le Entered By: Jeremy Le on 09/29/2019 08:30:47 Jeremy Le ([id number]) -------------------------------------------------------------------------------- Wound Assessment Details Patient Name: Jeremy Le Date of Service: 09/29/2019 8:00 AM Medical Record Number: [id number] Patient Account Number: [id number] Date of Birth/Sex: 11/17/1921 (86 y.o. M) Treating RN: Jeremy Le Primary Care Jaciel Diem: Jeremy Le Other Clinician: Referring Itati Brocksmith: Jeremy Le Treating Yola Paradiso/Extender: Jeremy Le, Jeremy Jeremy Le: 1 Wound Status Wound Number: 5 Primary Etiology: Skin Tear Wound Location: Left Upper Arm Wound Status: Healed - Epithelialized Wounding Event: Trauma Comorbid History: Glaucoma, Anemia, Hypertension, Type II Diabetes Date Acquired: 09/16/2019 Le Of Le: 1 Clustered Wound: No Wound Measurements Length: (cm) Width: (cm) Depth: (cm) Area: (cm) Volume: (cm) 0 % Reduction in Area: 100% 0 % Reduction in Volume: 100%  0 Epithelialization: Small (1-33%) 0 Tunneling: No 0  Undermining: No Wound Description Classification: Full Thickness Without Exposed Support Structu Wound Margin: Flat and Intact Exudate Amount: Small Exudate Type: Serous Exudate Color: amber res Foul Odor After Cleansing: No Slough/Fibrino Yes Wound Bed Granulation Amount: Large (67-100%) Exposed Structure Granulation Quality: Pink Fascia Exposed: No Necrotic Amount: None Present (0%) Fat Layer (Subcutaneous Tissue) Exposed: Yes Tendon Exposed: No Muscle Exposed: No Joint Exposed: No Bone Exposed: No Electronic Signature(s) Signed: 09/29/2019 9:13:28 AM By: Jeremy Le Entered By: Jeremy Le on 09/29/2019 08:27:01 Jeremy Le ([id number]) -------------------------------------------------------------------------------- Wound Assessment Details Patient Name: Jeremy Le Date of Service: 09/29/2019 8:00 AM Medical Record Number: [id number] Patient Account Number: [id number] Date of Birth/Sex: [birth year] (86 y.o. M) Treating RN: Jeremy Le Primary Care Ketrick Matney: Jeremy Le Other Clinician: Referring Tammela Bales: Jeremy Le Treating Draco Malczewski/Extender: Jeremy Le, Jeremy Jeremy Le: 1 Wound Status Wound Number: 6 Primary Etiology: Trauma, Other Wound Location: Back - Midline Wound Status: Open Wounding Event: Trauma Comorbid History: Glaucoma, Anemia, Hypertension, Type II Diabetes Date Acquired: 09/16/2019 Le Of Le: 1 Clustered Wound: No Wound Measurements Length: (cm) 0.1 Width: (cm) 0.1 Depth: (cm) 0.1 Area: (cm) 0.008 Volume: (cm) 0.001 % Reduction in Area: 99.4% % Reduction in Volume: 99.3% Epithelialization: Large (67-100%) Tunneling: No Undermining: No Wound Description Classification: Full Thickness Without Exposed Support Structure Wound Margin: Flat and Intact Exudate Amount: None Present s Foul Odor After Cleansing: No Slough/Fibrino No Wound Bed Granulation Amount: None Present (0%) Exposed Structure Necrotic Amount:  None Present (0%) Fascia Exposed: No Fat Layer (Subcutaneous Tissue) Exposed: No Tendon Exposed: No Muscle Exposed: No Joint Exposed: No Bone Exposed: No Limited to Skin Breakdown Assessment Notes hematoma measures 12.5x9.5 cm Le Notes Wound #6 (Midline Back) Notes TCA, ABD, tape Electronic Signature(s) Signed: 09/29/2019 9:13:28 AM By: Jeremy Le Signed: 09/29/2019 4:05:00 PM By: Jeremy Le Entered By: Jeremy Le on 09/29/2019 08:28:39 Jeremy Le ([id number]) -------------------------------------------------------------------------------- Vitals Details Patient Name: Jeremy Le Date of Service: 09/29/2019 8:00 AM Medical Record Number: [id number] Patient Account Number: [id number] Date of Birth/Sex: [birth year] (86 y.o. M) Treating RN: Jeremy Le Primary Care Jaion Lagrange: Jeremy Le Other Clinician: Referring Milani Lowenstein: Jeremy Le Treating Keimon Basaldua/Extender: Jeremy Le, Jeremy Jeremy Le: 1 Vital Signs Time Taken: 08:15 Temperature (F): 98.6 Height (in): 67 Pulse (bpm): 61 Weight (lbs): 165 Respiratory Rate (breaths/min): 16 Body Mass Index (BMI): 25.8 Blood Pressure (mmHg): 105/48 Reference Range: 80 - 120 mg / dl Electronic Signature(s) Signed: 09/29/2019 4:05:00 PM By: Jeremy Le Entered By: Jeremy Le on 09/29/2019 08:15:53

## 2019-10-06 ENCOUNTER — Encounter: Payer: Medicare Other | Admitting: Physician Assistant

## 2019-10-06 ENCOUNTER — Other Ambulatory Visit: Payer: Self-pay

## 2019-10-06 DIAGNOSIS — S40022A Contusion of left upper arm, initial encounter: Secondary | ICD-10-CM | POA: Diagnosis not present

## 2019-10-07 NOTE — Progress Notes (Signed)
[name], [name] ([id number]) Visit Report for 10/06/2019 Arrival Information Details Patient Name: Jeremy Le Date of Service: 10/06/2019 8:00 AM Medical Record Number: [id number] Patient Account Number: [id number] Date of Birth/Sex: [birth year] (86 y.o. M) Treating RN: Cornell Barman Primary Care Denver Harder: Paulita Cradle Other Clinician: Referring Jesenia Spera: Paulita Cradle Treating Brylei Pedley/Extender: Melburn Hake, HOYT Weeks in Treatment: 2 Visit Information History Since Last Visit Added or deleted any medications: No Patient Arrived: Wheel Chair Has Dressing in Place as Prescribed: Yes Arrival Time: 08:07 Pain Present Now: No Accompanied By: daughter Transfer Assistance: None Patient Identification Verified: Yes Secondary Verification Process Completed: Yes Notes Patient stays in chair. Electronic Signature(s) Signed: 10/07/2019 5:21:44 PM By: Gretta Cool, BSN, RN, CWS, Kim RN, BSN Entered By: Gretta Cool, BSN, RN, CWS, Kim on 10/06/2019 08:07:35 Jeremy Le ([id number]) -------------------------------------------------------------------------------- Clinic Level of Care Assessment Details Patient Name: Jeremy Le Date of Service: 10/06/2019 8:00 AM Medical Record Number: [id number] Patient Account Number: [id number] Date of Birth/Sex: [birth year] (86 y.o. M) Treating RN: Cornell Barman Primary Care Keawe Marcello: Paulita Cradle Other Clinician: Referring Stevana Dufner: Paulita Cradle Treating Kodah Maret/Extender: Melburn Hake, HOYT Weeks in Treatment: 2 Clinic Level of Care Assessment Items TOOL 4 Quantity Score []  - Use when only an EandM is performed on FOLLOW-UP visit 0 ASSESSMENTS - Nursing Assessment / Reassessment X - Reassessment of Co-morbidities (includes updates in patient status) 1 10 X- 1 5 Reassessment of Adherence to Treatment Plan ASSESSMENTS - Wound and Skin Assessment / Reassessment X - Simple Wound Assessment / Reassessment - one wound 1 5 []  - 0 Complex Wound Assessment /  Reassessment - multiple wounds []  - 0 Dermatologic / Skin Assessment (not related to wound area) ASSESSMENTS - Focused Assessment []  - Circumferential Edema Measurements - multi extremities 0 []  - 0 Nutritional Assessment / Counseling / Intervention []  - 0 Lower Extremity Assessment (monofilament, tuning fork, pulses) []  - 0 Peripheral Arterial Disease Assessment (using hand held doppler) ASSESSMENTS - Ostomy and/or Continence Assessment and Care []  - Incontinence Assessment and Management 0 []  - 0 Ostomy Care Assessment and Management (repouching, etc.) PROCESS - Coordination of Care X - Simple Patient / Family Education for ongoing care 1 15 []  - 0 Complex (extensive) Patient / Family Education for ongoing care []  - 0 Staff obtains Programmer, systems, Records, Test Results / Process Orders []  - 0 Staff telephones HHA, Nursing Homes / Clarify orders / etc []  - 0 Routine Transfer to another Facility (non-emergent condition) []  - 0 Routine Hospital Admission (non-emergent condition) []  - 0 New Admissions / Biomedical engineer / Ordering NPWT, Apligraf, etc. []  - 0 Emergency Hospital Admission (emergent condition) X- 1 10 Simple Discharge Coordination []  - 0 Complex (extensive) Discharge Coordination PROCESS - Special Needs []  - Pediatric / Minor Patient Management 0 []  - 0 Isolation Patient Management []  - 0 Hearing / Language / Visual special needs []  - 0 Assessment of Community assistance (transportation, D/C planning, etc.) MAVERYK, MYUNG ([id number]) []  - 0 Additional assistance / Altered mentation []  - 0 Support Surface(s) Assessment (bed, cushion, seat, etc.) INTERVENTIONS - Wound Cleansing / Measurement X - Simple Wound Cleansing - one wound 1 5 []  - 0 Complex Wound Cleansing - multiple wounds X- 1 5 Wound Imaging (photographs - any number of wounds) []  - 0 Wound Tracing (instead of photographs) X- 1 5 Simple Wound Measurement - one wound []  - 0 Complex Wound  Measurement - multiple wounds INTERVENTIONS - Wound Dressings []  - Small Wound Dressing one or multiple wounds 0 []  - 0  Medium Wound Dressing one or multiple wounds []  - 0 Large Wound Dressing one or multiple wounds []  - 0 Application of Medications - topical []  - 0 Application of Medications - injection INTERVENTIONS - Miscellaneous []  - External ear exam 0 []  - 0 Specimen Collection (cultures, biopsies, blood, body fluids, etc.) []  - 0 Specimen(s) / Culture(s) sent or taken to Lab for analysis []  - 0 Patient Transfer (multiple staff / Harrel Lemon Lift / Similar devices) []  - 0 Simple Staple / Suture removal (25 or less) []  - 0 Complex Staple / Suture removal (26 or more) []  - 0 Hypo / Hyperglycemic Management (close monitor of Blood Glucose) []  - 0 Ankle / Brachial Index (ABI) - do not check if billed separately X- 1 5 Vital Signs Has the patient been seen at the hospital within the last three years: Yes Total Score: 65 Level Of Care: New/Established - Level 2 Electronic Signature(s) Signed: 10/07/2019 5:21:44 PM By: Gretta Cool, BSN, RN, CWS, Kim RN, BSN Entered By: Gretta Cool, BSN, RN, CWS, Kim on 10/06/2019 08:23:48 Jeremy Le ([id number]) -------------------------------------------------------------------------------- Encounter Discharge Information Details Patient Name: Jeremy Le Date of Service: 10/06/2019 8:00 AM Medical Record Number: [id number] Patient Account Number: [id number] Date of Birth/Sex: 1922/07/03 (86 y.o. M) Treating RN: Cornell Barman Primary Care Yu Peggs: Paulita Cradle Other Clinician: Referring Quaneisha Hanisch: Paulita Cradle Treating Edword Cu/Extender: Sharalyn Ink in Treatment: 2 Encounter Discharge Information Items Discharge Condition: Stable Ambulatory Status: Wheelchair Discharge Destination: Home Transportation: Private Auto Accompanied By: daughter Schedule Follow-up Appointment: Yes Clinical Summary of Care: Electronic  Signature(s) Signed: 10/07/2019 5:21:44 PM By: Gretta Cool, BSN, RN, CWS, Kim RN, BSN Entered By: Gretta Cool, BSN, RN, CWS, Kim on 10/06/2019 08:24:40 Jeremy Le ([id number]) -------------------------------------------------------------------------------- Lower Extremity Assessment Details Patient Name: Jeremy Le Date of Service: 10/06/2019 8:00 AM Medical Record Number: [id number] Patient Account Number: [id number] Date of Birth/Sex: Aug 28, 1921 (86 y.o. M) Treating RN: Cornell Barman Primary Care Dora Simeone: Paulita Cradle Other Clinician: Referring Axiel Fjeld: Paulita Cradle Treating Keni Elison/Extender: Sharalyn Ink in Treatment: 2 Electronic Signature(s) Signed: 10/07/2019 5:21:44 PM By: Gretta Cool, BSN, RN, CWS, Kim RN, BSN Entered By: Gretta Cool, BSN, RN, CWS, Kim on 10/06/2019 08:09:46 Jeremy Le ([id number]) -------------------------------------------------------------------------------- Multi Wound Chart Details Patient Name: Jeremy Le Date of Service: 10/06/2019 8:00 AM Medical Record Number: [id number] Patient Account Number: [id number] Date of Birth/Sex: Apr 28, 1922 (86 y.o. M) Treating RN: Cornell Barman Primary Care Charlesa Ehle: Paulita Cradle Other Clinician: Referring Rashika Bettes: Paulita Cradle Treating Qusay Villada/Extender: Melburn Hake, HOYT Weeks in Treatment: 2 Vital Signs Height(in): 67 Pulse(bpm): 59 Weight(lbs): 165 Blood Pressure(mmHg): 128/42 Body Mass Index(BMI): 26 Temperature(F): 98.2 Respiratory Rate(breaths/min): 16 Photos: [6:No Photos] [N/A:N/A] Wound Location: [6:Midline Back] [N/A:N/A] Wounding Event: [6:Trauma] [N/A:N/A] Primary Etiology: [6:Trauma, Other] [N/A:N/A] Date Acquired: [6:09/16/2019] [N/A:N/A] Weeks of Treatment: [6:2] [N/A:N/A] Wound Status: [6:Healed - Epithelialized] [N/A:N/A] Measurements L x W x D (cm) [6:0x0x0] [N/A:N/A] Area (cm) : [6:0] [N/A:N/A] Volume (cm) : [6:0] [N/A:N/A] % Reduction in Area: [6:100.00%] [N/A:N/A] % Reduction in  Volume: [6:100.00%] [N/A:N/A] Classification: [6:Full Thickness Without Exposed Support Structures] [N/A:N/A] Treatment Notes Electronic Signature(s) Signed: 10/07/2019 5:21:44 PM By: Gretta Cool, BSN, RN, CWS, Kim RN, BSN Entered By: Gretta Cool, BSN, RN, CWS, Kim on 10/06/2019 08:10:26 Jeremy Le ([id number]) -------------------------------------------------------------------------------- Multi-Disciplinary Care Plan Details Patient Name: Jeremy Le Date of Service: 10/06/2019 8:00 AM Medical Record Number: [id number] Patient Account Number: [id number] Date of Birth/Sex: 25-Oct-1921 (86 y.o. M) Treating RN: Cornell Barman Primary Care Komal Stangelo: Paulita Cradle Other Clinician: Referring Tallon Gertz: Paulita Cradle Treating Detravion Tester/Extender: Melburn Hake, HOYT  Weeks in Treatment: 2 Active Inactive Engineer, maintenance) Signed: 10/07/2019 5:21:44 PM By: Gretta Cool, BSN, RN, CWS, Kim RN, BSN Entered By: Gretta Cool, BSN, RN, CWS, Kim on 10/06/2019 08:10:10 Jeremy Le ([id number]) -------------------------------------------------------------------------------- Pain Assessment Details Patient Name: Jeremy Le Date of Service: 10/06/2019 8:00 AM Medical Record Number: [id number] Patient Account Number: [id number] Date of Birth/Sex: July 29, 1921 (86 y.o. M) Treating RN: Cornell Barman Primary Care Camisha Srey: Paulita Cradle Other Clinician: Referring Tianna Baus: Paulita Cradle Treating Ambre Kobayashi/Extender: Melburn Hake, HOYT Weeks in Treatment: 2 Active Problems Location of Pain Severity and Description of Pain Patient Has Paino No Site Locations Pain Management and Medication Current Pain Management: Electronic Signature(s) Signed: 10/07/2019 5:21:44 PM By: Gretta Cool, BSN, RN, CWS, Kim RN, BSN Entered By: Gretta Cool, BSN, RN, CWS, Kim on 10/06/2019 08:08:56 Jeremy Le ([id number]) -------------------------------------------------------------------------------- Patient/Caregiver Education Details Patient Name: Jeremy Le Date of Service: 10/06/2019 8:00 AM Medical Record Number: [id number] Patient Account Number: [id number] Date of Birth/Gender: [birth year] (86 y.o. M) Treating RN: Cornell Barman Primary Care Physician: Paulita Cradle Other Clinician: Referring Physician: Paulita Cradle Treating Physician/Extender: Sharalyn Ink in Treatment: 2 Education Assessment Education Provided To: Patient Education Topics Provided Wound/Skin Impairment: Handouts: Caring for Your Ulcer Methods: Demonstration, Explain/Verbal Responses: State content correctly Electronic Signature(s) Signed: 10/07/2019 5:21:44 PM By: Gretta Cool, BSN, RN, CWS, Kim RN, BSN Entered By: Gretta Cool, BSN, RN, CWS, Kim on 10/06/2019 08:24:11 Jeremy Le ([id number]) -------------------------------------------------------------------------------- Wound Assessment Details Patient Name: Jeremy Le Date of Service: 10/06/2019 8:00 AM Medical Record Number: [id number] Patient Account Number: [id number] Date of Birth/Sex: March 18, 1922 (86 y.o. M) Treating RN: Cornell Barman Primary Care Lorretta Kerce: Paulita Cradle Other Clinician: Referring Tatijana Bierly: Paulita Cradle Treating Toluwani Yadav/Extender: Melburn Hake, HOYT Weeks in Treatment: 2 Wound Status Wound Number: 6 Primary Etiology: Trauma, Other Wound Location: Midline Back Wound Status: Healed - Epithelialized Wounding Event: Trauma Date Acquired: 09/16/2019 Weeks Of Treatment: 2 Clustered Wound: No Photos Photo Uploaded By: Gretta Cool, BSN, RN, CWS, Kim on 10/06/2019 08:11:58 Wound Measurements Length: (cm) Width: (cm) Depth: (cm) Area: (cm) Volume: (cm) 0 % Reduction in Area: 100% 0 % Reduction in Volume: 100% 0 0 0 Wound Description Classification: Full Thickness Without Exposed Support Structu res Engineer, maintenance) Signed: 10/07/2019 5:21:44 PM By: Gretta Cool, BSN, RN, CWS, Kim RN, BSN Entered By: Gretta Cool, BSN, RN, CWS, Kim on 10/06/2019 08:09:39 Jeremy Le  ([id number]) -------------------------------------------------------------------------------- Vitals Details Patient Name: Jeremy Le Date of Service: 10/06/2019 8:00 AM Medical Record Number: [id number] Patient Account Number: [id number] Date of Birth/Sex: March 23, 1922 (86 y.o. M) Treating RN: Cornell Barman Primary Care Thomasine Klutts: Paulita Cradle Other Clinician: Referring Eudell Julian: Paulita Cradle Treating Taura Lamarre/Extender: Melburn Hake, HOYT Weeks in Treatment: 2 Vital Signs Time Taken: 08:07 Temperature (F): 98.2 Height (in): 67 Pulse (bpm): 59 Weight (lbs): 165 Respiratory Rate (breaths/min): 16 Body Mass Index (BMI): 25.8 Blood Pressure (mmHg): 128/42 Reference Range: 80 - 120 mg / dl Electronic Signature(s) Signed: 10/07/2019 5:21:44 PM By: Gretta Cool, BSN, RN, CWS, Kim RN, BSN Entered By: Gretta Cool, BSN, RN, CWS, Kim on 10/06/2019 08:08:00

## 2019-10-07 NOTE — Progress Notes (Signed)
LENDEN, SIRBAUGH ([id number]) Visit Report for 10/06/2019 Chief Complaint Document Details Patient Name: Jeremy Le, Jeremy Le Date of Service: 10/06/2019 8:00 AM Medical Record Number: [id number] Patient Account Number: [id number] Date of Birth/Sex: May 30, 1922 (86 y.o. M) Treating RN: Cornell Barman Primary Care Provider: Paulita Cradle Other Clinician: Referring Provider: Paulita Cradle Treating Provider/Extender: Melburn Hake, HOYT Weeks in Treatment: 2 Information Obtained from: Patient Chief Complaint Left upper arm contusion/skin tear and lower back contusion/hematoma Electronic Signature(s) Signed: 10/06/2019 5:16:18 PM By: Worthy Keeler PA-C Entered By: Worthy Keeler on 10/06/2019 08:16:53 Jeremy Le ([id number]) -------------------------------------------------------------------------------- HPI Details Patient Name: Jeremy Le Date of Service: 10/06/2019 8:00 AM Medical Record Number: [id number] Patient Account Number: [id number] Date of Birth/Sex: 1921-10-18 (86 y.o. M) Treating RN: Cornell Barman Primary Care Provider: Paulita Cradle Other Clinician: Referring Provider: Paulita Cradle Treating Provider/Extender: Melburn Hake, HOYT Weeks in Treatment: 2 History of Present Illness HPI Description: 09/22/2019 upon evaluation today patient presents for initial inspection here in our clinic concerning issues that he is having with his left upper arm in and around the elbow along with his midline back. At both locations he has very small ulcerations. The elbow seems to be more of a skin tear in the back is more of a trauma secondary to a fall which has caused a contusion/hematoma in the region which is much more of a concern at this point. The good news is his daughter who is present with him today in the office during the visit states that this is actually improved since that initially started to enlarge. The hematoma has shrunk and it does seem to be that some of the bruising is flowing  downward into the lumbosacral and potentially will even end up in the gluteal region. I told her that this is a normal phenomenon and not something that we would be concerned about. With that being said I do think that this is something we want to watch to make sure it does not continue to get larger it sounds like it is gotten smaller that is really what we want to see. Otherwise the patient does have a history of hypertension, diabetes mellitus type 2, and generalized muscle weakness. 09/29/2019 on evaluation today the patient actually appears to be doing excellent. His elbow is really appearing healed today which is great news overall. The midline back is also doing better which is also great news. In fact I am not even sure this is open that we will likely monitor this for 1 more week. Fortunately there is no signs of infection at this time which is good news. In fact I am not even sure anything is open on the back other than I will likely watch this 1 more week just due to the hematoma. 10/06/2019 upon evaluation today patient appears to be doing excellent in regard to his contusion on the back. Nothing has reopened and overall he seems to be doing very well. I think he is completely healed and ready for discharge at this point. Electronic Signature(s) Signed: 10/06/2019 10:24:14 AM By: Worthy Keeler PA-C Entered By: Worthy Keeler on 10/06/2019 10:24:14 Jeremy Le ([id number]) -------------------------------------------------------------------------------- Physical Exam Details Patient Name: Jeremy Le Date of Service: 10/06/2019 8:00 AM Medical Record Number: [id number] Patient Account Number: [id number] Date of Birth/Sex: May 09, 1922 (86 y.o. M) Treating RN: Cornell Barman Primary Care Provider: Paulita Cradle Other Clinician: Referring Provider: Paulita Cradle Treating Provider/Extender: Melburn Hake, HOYT Weeks in Treatment: 2 Constitutional Well-nourished and well-hydrated in no  acute distress. Respiratory  normal breathing without difficulty. Psychiatric this patient is able to make decisions and demonstrates good insight into disease process. Alert and Oriented x 3. pleasant and cooperative. Notes Patient's woundcurrently on both the elbow as well as the back showed signs of complete epithelization the contusion on the back also continues to resolve fairly rapidly which is also excellent news overall and pleased in this regard. The patient's daughter is also very happy. Electronic Signature(s) Signed: 10/06/2019 10:40:44 AM By: Worthy Keeler PA-C Entered By: Worthy Keeler on 10/06/2019 10:40:43 Jeremy Le ([id number]) -------------------------------------------------------------------------------- Physician Orders Details Patient Name: Jeremy Le Date of Service: 10/06/2019 8:00 AM Medical Record Number: [id number] Patient Account Number: [id number] Date of Birth/Sex: 02/14/1922 (86 y.o. M) Treating RN: Cornell Barman Primary Care Provider: Paulita Cradle Other Clinician: Referring Provider: Paulita Cradle Treating Provider/Extender: Melburn Hake, HOYT Weeks in Treatment: 2 Verbal / Phone Orders: No Diagnosis Coding ICD-10 Coding Code Description S41.102A Unspecified open wound of left upper arm, initial encounter S30.0XXA Contusion of lower back and pelvis, initial encounter S21.209A Unspecified open wound of unspecified back wall of thorax without penetration into thoracic cavity, initial encounter I10 Essential (primary) hypertension E11.622 Type 2 diabetes mellitus with other skin ulcer M62.81 Muscle weakness (generalized) Discharge From [facility] o Discharge from [place] - treatment complete Electronic Signature(s) Signed: 10/06/2019 5:16:18 PM By: Worthy Keeler PA-C Signed: 10/07/2019 5:21:44 PM By: Gretta Cool, BSN, RN, CWS, Kim RN, BSN Entered By: Gretta Cool, BSN, RN, CWS, Kim on 10/06/2019 08:23:21 Jeremy Le  ([id number]) -------------------------------------------------------------------------------- Problem List Details Patient Name: Jeremy Le Date of Service: 10/06/2019 8:00 AM Medical Record Number: [id number] Patient Account Number: [id number] Date of Birth/Sex: 10-Dec-1921 (86 y.o. M) Treating RN: Cornell Barman Primary Care Provider: Paulita Cradle Other Clinician: Referring Provider: Paulita Cradle Treating Provider/Extender: Melburn Hake, HOYT Weeks in Treatment: 2 Active Problems ICD-10 Evaluated Encounter Code Description Active Date Today Diagnosis S41.102A Unspecified open wound of left upper arm, initial encounter 09/22/2019 No Yes S30.0XXA Contusion of lower back and pelvis, initial encounter 09/22/2019 No Yes S21.209A Unspecified open wound of unspecified back wall of thorax without 09/22/2019 No Yes penetration into thoracic cavity, initial encounter I10 Essential (primary) hypertension 09/22/2019 No Yes E11.622 Type 2 diabetes mellitus with other skin ulcer 09/22/2019 No Yes M62.81 Muscle weakness (generalized) 09/22/2019 No Yes Inactive Problems Resolved Problems Electronic Signature(s) Signed: 10/06/2019 5:16:18 PM By: Worthy Keeler PA-C Entered By: Worthy Keeler on 10/06/2019 08:16:24 Jeremy Le ([id number]) -------------------------------------------------------------------------------- Progress Note Details Patient Name: Jeremy Le Date of Service: 10/06/2019 8:00 AM Medical Record Number: [id number] Patient Account Number: [id number] Date of Birth/Sex: [birth year] (86 y.o. M) Treating RN: Cornell Barman Primary Care Provider: Paulita Cradle Other Clinician: Referring Provider: Paulita Cradle Treating Provider/Extender: Melburn Hake, HOYT Weeks in Treatment: 2 Subjective Chief Complaint Information obtained from Patient Left upper arm contusion/skin tear and lower back contusion/hematoma History of Present Illness (HPI) 09/22/2019 upon evaluation today patient  presents for initial inspection here in our clinic concerning issues that he is having with his left upper arm in and around the elbow along with his midline back. At both locations he has very small ulcerations. The elbow seems to be more of a skin tear in the back is more of a trauma secondary to a fall which has caused a contusion/hematoma in the region which is much more of a concern at this point. The good news is his daughter who is present with him today in the office during the visit states that  this is actually improved since that initially started to enlarge. The hematoma has shrunk and it does seem to be that some of the bruising is flowing downward into the lumbosacral and potentially will even end up in the gluteal region. I told her that this is a normal phenomenon and not something that we would be concerned about. With that being said I do think that this is something we want to watch to make sure it does not continue to get larger it sounds like it is gotten smaller that is really what we want to see. Otherwise the patient does have a history of hypertension, diabetes mellitus type 2, and generalized muscle weakness. 09/29/2019 on evaluation today the patient actually appears to be doing excellent. His elbow is really appearing healed today which is great news overall. The midline back is also doing better which is also great news. In fact I am not even sure this is open that we will likely monitor this for 1 more week. Fortunately there is no signs of infection at this time which is good news. In fact I am not even sure anything is open on the back other than I will likely watch this 1 more week just due to the hematoma. 10/06/2019 upon evaluation today patient appears to be doing excellent in regard to his contusion on the back. Nothing has reopened and overall he seems to be doing very well. I think he is completely healed and ready for discharge at this  point. Objective Constitutional Well-nourished and well-hydrated in no acute distress. Vitals Time Taken: 8:07 AM, Height: 67 in, Weight: 165 lbs, BMI: 25.8, Temperature: 98.2 F, Pulse: 59 bpm, Respiratory Rate: 16 breaths/min, Blood Pressure: 128/42 mmHg. Respiratory normal breathing without difficulty. Psychiatric this patient is able to make decisions and demonstrates good insight into disease process. Alert and Oriented x 3. pleasant and cooperative. General Notes: Patient's woundcurrently on both the elbow as well as the back showed signs of complete epithelization the contusion on the back also continues to resolve fairly rapidly which is also excellent news overall and pleased in this regard. The patient's daughter is also very happy. Integumentary (Hair, Skin) Wound #6 status is Healed - Epithelialized. Original cause of wound was Trauma. The wound is located on the Midline Back. The wound measures 0cm length x 0cm width x 0cm depth; 0cm^2 area and 0cm^3 volume. Jeremy Le, Jeremy Le ([id number]) Assessment Active Problems ICD-10 Unspecified open wound of left upper arm, initial encounter Contusion of lower back and pelvis, initial encounter Unspecified open wound of unspecified back wall of thorax without penetration into thoracic cavity, initial encounter Essential (primary) hypertension Type 2 diabetes mellitus with other skin ulcer Muscle weakness (generalized) Plan Discharge From [facility]: Discharge from [place] - treatment complete 1. My suggestion at this time is can be that we go ahead and continue with the current wound care measures as far as protecting the areas. Everything is healed so we will discontinue wound care services. With that being said I still think the patient is getting need ongoing care to make sure that nothing breaks down in regard to the backslash hematoma area and this was discussed with his daughter. They will keep an eye on this. The elbow  looks to be doing great. We will see the patient back for follow-up visit as needed. Electronic Signature(s) Signed: 10/06/2019 10:41:21 AM By: Worthy Keeler PA-C Entered By: Worthy Keeler on 10/06/2019 10:41:20 Jeremy Le ([id number]) -------------------------------------------------------------------------------- SuperBill Details Patient  Name: Jeremy Le Date of Service: 10/06/2019 Medical Record Number: [id number] Patient Account Number: [id number] Date of Birth/Sex: [birth year] (86 y.o. M) Treating RN: Cornell Barman Primary Care Provider: Paulita Cradle Other Clinician: Referring Provider: Paulita Cradle Treating Provider/Extender: Melburn Hake, HOYT Weeks in Treatment: 2 Diagnosis Coding ICD-10 Codes Code Description S41.102A Unspecified open wound of left upper arm, initial encounter S30.0XXA Contusion of lower back and pelvis, initial encounter S21.209A Unspecified open wound of unspecified back wall of thorax without penetration into thoracic cavity, initial encounter I10 Essential (primary) hypertension E11.622 Type 2 diabetes mellitus with other skin ulcer M62.81 Muscle weakness (generalized) Facility Procedures CPT4 Code: [id number] Description: [id number] - WOUND CARE VISIT-LEV 2 EST PT Modifier: Quantity: 1 Physician Procedures CPT4: Description Modifier Quantity Code [id number] - WC PHYS LEVEL 2 - EST PT 1 CPT4: ICD-10 Diagnosis Description [id number] Unspecified open wound of left upper arm, initial encounter S30.0XXA Contusion of lower back and pelvis, initial encounter S21.209A Unspecified open wound of unspecified back wall of thorax without penetration  into thoracic cavity, initial encounter I10 Essential (primary) hypertension Electronic Signature(s) Signed: 10/06/2019 10:41:36 AM By: Worthy Keeler PA-C Entered By: Worthy Keeler on 10/06/2019 10:41:35

## 2019-10-13 ENCOUNTER — Encounter: Payer: Medicare Other | Admitting: Physician Assistant

## 2020-04-19 ENCOUNTER — Inpatient Hospital Stay
Admission: EM | Admit: 2020-04-19 | Discharge: 2020-04-30 | DRG: 682 | Disposition: A | Discharge status: Hospice | Payer: Medicare Other | Source: Skilled Nursing Facility | Attending: Internal Medicine | Admitting: Internal Medicine

## 2020-04-19 ENCOUNTER — Encounter: Payer: Self-pay | Admitting: Internal Medicine

## 2020-04-19 ENCOUNTER — Other Ambulatory Visit: Payer: Self-pay

## 2020-04-19 DIAGNOSIS — Z515 Encounter for palliative care: Secondary | ICD-10-CM | POA: Diagnosis not present

## 2020-04-19 DIAGNOSIS — Z20822 Contact with and (suspected) exposure to covid-19: Secondary | ICD-10-CM | POA: Diagnosis present

## 2020-04-19 DIAGNOSIS — Z79899 Other long term (current) drug therapy: Secondary | ICD-10-CM

## 2020-04-19 DIAGNOSIS — E870 Hyperosmolality and hypernatremia: Secondary | ICD-10-CM | POA: Diagnosis present

## 2020-04-19 DIAGNOSIS — F39 Unspecified mood [affective] disorder: Secondary | ICD-10-CM | POA: Diagnosis present

## 2020-04-19 DIAGNOSIS — N179 Acute kidney failure, unspecified: Secondary | ICD-10-CM | POA: Diagnosis present

## 2020-04-19 DIAGNOSIS — E1122 Type 2 diabetes mellitus with diabetic chronic kidney disease: Secondary | ICD-10-CM | POA: Diagnosis present

## 2020-04-19 DIAGNOSIS — Z66 Do not resuscitate: Secondary | ICD-10-CM | POA: Diagnosis present

## 2020-04-19 DIAGNOSIS — Z833 Family history of diabetes mellitus: Secondary | ICD-10-CM | POA: Diagnosis not present

## 2020-04-19 DIAGNOSIS — R630 Anorexia: Secondary | ICD-10-CM | POA: Diagnosis present

## 2020-04-19 DIAGNOSIS — G9341 Metabolic encephalopathy: Secondary | ICD-10-CM

## 2020-04-19 DIAGNOSIS — N390 Urinary tract infection, site not specified: Secondary | ICD-10-CM

## 2020-04-19 DIAGNOSIS — E1142 Type 2 diabetes mellitus with diabetic polyneuropathy: Secondary | ICD-10-CM | POA: Diagnosis present

## 2020-04-19 DIAGNOSIS — N189 Chronic kidney disease, unspecified: Secondary | ICD-10-CM | POA: Diagnosis not present

## 2020-04-19 DIAGNOSIS — D329 Benign neoplasm of meninges, unspecified: Secondary | ICD-10-CM | POA: Diagnosis present

## 2020-04-19 DIAGNOSIS — Z8051 Family history of malignant neoplasm of kidney: Secondary | ICD-10-CM | POA: Diagnosis not present

## 2020-04-19 DIAGNOSIS — L89322 Pressure ulcer of left buttock, stage 2: Secondary | ICD-10-CM | POA: Diagnosis present

## 2020-04-19 DIAGNOSIS — E876 Hypokalemia: Secondary | ICD-10-CM | POA: Diagnosis not present

## 2020-04-19 DIAGNOSIS — E1169 Type 2 diabetes mellitus with other specified complication: Secondary | ICD-10-CM

## 2020-04-19 DIAGNOSIS — H9190 Unspecified hearing loss, unspecified ear: Secondary | ICD-10-CM | POA: Diagnosis present

## 2020-04-19 DIAGNOSIS — Z8546 Personal history of malignant neoplasm of prostate: Secondary | ICD-10-CM | POA: Diagnosis not present

## 2020-04-19 DIAGNOSIS — B9620 Unspecified Escherichia coli [E. coli] as the cause of diseases classified elsewhere: Secondary | ICD-10-CM | POA: Diagnosis present

## 2020-04-19 DIAGNOSIS — N1832 Chronic kidney disease, stage 3b: Secondary | ICD-10-CM | POA: Diagnosis present

## 2020-04-19 DIAGNOSIS — Z87891 Personal history of nicotine dependence: Secondary | ICD-10-CM | POA: Diagnosis not present

## 2020-04-19 DIAGNOSIS — L89302 Pressure ulcer of unspecified buttock, stage 2: Secondary | ICD-10-CM | POA: Diagnosis not present

## 2020-04-19 DIAGNOSIS — E860 Dehydration: Secondary | ICD-10-CM | POA: Diagnosis present

## 2020-04-19 DIAGNOSIS — L89312 Pressure ulcer of right buttock, stage 2: Secondary | ICD-10-CM | POA: Diagnosis present

## 2020-04-19 DIAGNOSIS — E785 Hyperlipidemia, unspecified: Secondary | ICD-10-CM | POA: Diagnosis present

## 2020-04-19 DIAGNOSIS — R4182 Altered mental status, unspecified: Secondary | ICD-10-CM

## 2020-04-19 DIAGNOSIS — E871 Hypo-osmolality and hyponatremia: Secondary | ICD-10-CM | POA: Diagnosis not present

## 2020-04-19 DIAGNOSIS — N210 Calculus in bladder: Secondary | ICD-10-CM | POA: Diagnosis present

## 2020-04-19 DIAGNOSIS — L8990 Pressure ulcer of unspecified site, unspecified stage: Secondary | ICD-10-CM | POA: Insufficient documentation

## 2020-04-19 LAB — COMPREHENSIVE METABOLIC PANEL
ALT: 19 U/L (ref 0–44)
AST: 19 U/L (ref 15–41)
Albumin: 3.5 g/dL (ref 3.5–5.0)
Alkaline Phosphatase: 102 U/L (ref 38–126)
Anion gap: 16 — ABNORMAL HIGH (ref 5–15)
BUN: 63 mg/dL — ABNORMAL HIGH (ref 8–23)
CO2: 22 mmol/L (ref 22–32)
Calcium: 10.4 mg/dL — ABNORMAL HIGH (ref 8.9–10.3)
Chloride: 110 mmol/L (ref 98–111)
Creatinine, Ser: 1.7 mg/dL — ABNORMAL HIGH (ref 0.61–1.24)
GFR calc Af Amer: 39 mL/min — ABNORMAL LOW (ref >60)
GFR calc non Af Amer: 33 mL/min — ABNORMAL LOW (ref >60)
Glucose, Bld: 178 mg/dL — ABNORMAL HIGH (ref 70–99)
Potassium: 3.6 mmol/L (ref 3.5–5.1)
Sodium: 148 mmol/L — ABNORMAL HIGH (ref 135–145)
Total Bilirubin: 1.6 mg/dL — ABNORMAL HIGH (ref 0.3–1.2)
Total Protein: 7.3 g/dL (ref 6.5–8.1)

## 2020-04-19 LAB — URINALYSIS, COMPLETE (UACMP) WITH MICROSCOPIC
Bilirubin Urine: NEGATIVE
Glucose, UA: NEGATIVE mg/dL
Ketones, ur: 20 mg/dL — AB
Nitrite: NEGATIVE
Protein, ur: 30 mg/dL — AB
Specific Gravity, Urine: 1.018 (ref 1.005–1.030)
WBC, UA: >50 WBC/hpf — ABNORMAL HIGH (ref 0–5)
pH: 5 (ref 5.0–8.0)

## 2020-04-19 LAB — CBC WITH DIFFERENTIAL/PLATELET
Abs Immature Granulocytes: 0.04 10*3/uL (ref 0.00–0.07)
Basophils Absolute: 0 10*3/uL (ref 0.0–0.1)
Basophils Relative: 0 %
Eosinophils Absolute: 0 10*3/uL (ref 0.0–0.5)
Eosinophils Relative: 0 %
HCT: 40.8 % (ref 39.0–52.0)
Hemoglobin: 13.2 g/dL (ref 13.0–17.0)
Immature Granulocytes: 0 %
Lymphocytes Relative: 8 %
Lymphs Abs: 0.9 10*3/uL (ref 0.7–4.0)
MCH: 34 pg (ref 26.0–34.0)
MCHC: 32.4 g/dL (ref 30.0–36.0)
MCV: 105.2 fL — ABNORMAL HIGH (ref 80.0–100.0)
Monocytes Absolute: 0.6 10*3/uL (ref 0.1–1.0)
Monocytes Relative: 5 %
Neutro Abs: 10.2 10*3/uL — ABNORMAL HIGH (ref 1.7–7.7)
Neutrophils Relative %: 87 %
Platelets: 342 10*3/uL (ref 150–400)
RBC: 3.88 MIL/uL — ABNORMAL LOW (ref 4.22–5.81)
RDW: 12.4 % (ref 11.5–15.5)
WBC: 11.8 10*3/uL — ABNORMAL HIGH (ref 4.0–10.5)
nRBC: 0 % (ref 0.0–0.2)

## 2020-04-19 LAB — GLUCOSE, CAPILLARY
Glucose-Capillary: 158 mg/dL — ABNORMAL HIGH (ref 70–99)
Glucose-Capillary: 156 mg/dL — ABNORMAL HIGH (ref 70–99)

## 2020-04-19 LAB — LITHIUM LEVEL: Lithium Lvl: 0.44 mmol/L — ABNORMAL LOW (ref 0.60–1.20)

## 2020-04-19 LAB — LACTIC ACID, PLASMA: Lactic Acid, Venous: 1.5 mmol/L (ref 0.5–1.9)

## 2020-04-19 LAB — RESPIRATORY PANEL BY RT PCR (FLU A&B, COVID)
Influenza A by PCR: NEGATIVE
Influenza B by PCR: NEGATIVE
SARS Coronavirus 2 by RT PCR: NEGATIVE

## 2020-04-19 LAB — HEMOGLOBIN A1C
Hgb A1c MFr Bld: 6 % — ABNORMAL HIGH (ref 4.8–5.6)
Mean Plasma Glucose: 125.5 mg/dL

## 2020-04-19 LAB — PROTIME-INR
INR: 1.1 (ref 0.8–1.2)
Prothrombin Time: 13.5 seconds (ref 11.4–15.2)

## 2020-04-19 LAB — VITAMIN D 25 HYDROXY (VIT D DEFICIENCY, FRACTURES): Vit D, 25-Hydroxy: 46.68 ng/mL (ref 30–100)

## 2020-04-19 LAB — TSH: TSH: 1.922 u[IU]/mL (ref 0.350–4.500)

## 2020-04-19 LAB — CK: Total CK: 79 U/L (ref 49–397)

## 2020-04-19 MED ORDER — SODIUM CHLORIDE 0.9 % IV SOLN
1.0000 g | Freq: Once | INTRAVENOUS | Status: AC
  Administered 2020-04-19: 1 g via INTRAVENOUS
  Filled 2020-04-19: qty 10

## 2020-04-19 MED ORDER — INSULIN ASPART 100 UNIT/ML ~~LOC~~ SOLN: 0.0000 [IU] | Freq: Three times a day (TID) | SUBCUTANEOUS | Status: DC

## 2020-04-19 MED ORDER — PANTOPRAZOLE SODIUM 40 MG PO TBEC: 40.0000 mg | DELAYED_RELEASE_TABLET | Freq: Every day | ORAL | Status: DC

## 2020-04-19 MED ORDER — POLYETHYLENE GLYCOL 3350 17 G PO PACK: 17.0000 g | PACK | Freq: Every day | ORAL | Status: DC | PRN

## 2020-04-19 MED ORDER — SODIUM CHLORIDE 0.9 % IV SOLN: INTRAVENOUS | Status: DC

## 2020-04-19 MED ORDER — BUPROPION HCL ER (XL) 150 MG PO TB24: 150.0000 mg | ORAL_TABLET | Freq: Every day | ORAL | Status: DC

## 2020-04-19 MED ORDER — ADULT MULTIVITAMIN W/MINERALS CH: 1.0000 | ORAL_TABLET | Freq: Every day | ORAL | Status: DC

## 2020-04-19 MED ORDER — NEOMYCIN-POLYMYXIN-DEXAMETH 3.5-10000-0.1 OP OINT
1.0000 "application " | TOPICAL_OINTMENT | Freq: Every day | OPHTHALMIC | Status: DC
  Administered 2020-04-19 – 2020-04-29 (×11): 1 via OPHTHALMIC
  Filled 2020-04-19 (×2): qty 3.5

## 2020-04-19 MED ORDER — TIMOLOL MALEATE 0.5 % OP SOLN
1.0000 [drp] | Freq: Two times a day (BID) | OPHTHALMIC | Status: DC
  Administered 2020-04-19 – 2020-04-30 (×22): 1 [drp] via OPHTHALMIC
  Filled 2020-04-19 (×2): qty 5

## 2020-04-19 MED ORDER — ONDANSETRON HCL 4 MG PO TABS: 4.0000 mg | ORAL_TABLET | Freq: Four times a day (QID) | ORAL | Status: DC | PRN

## 2020-04-19 MED ORDER — ACETAMINOPHEN 325 MG PO TABS: 650.0000 mg | ORAL_TABLET | Freq: Four times a day (QID) | ORAL | Status: DC | PRN

## 2020-04-19 MED ORDER — ATORVASTATIN CALCIUM 20 MG PO TABS
10.0000 mg | ORAL_TABLET | Freq: Every day | ORAL | Status: DC
  Administered 2020-04-19: 10 mg via ORAL
  Filled 2020-04-19: qty 1

## 2020-04-19 MED ORDER — ONDANSETRON HCL 4 MG/2ML IJ SOLN: 4.0000 mg | Freq: Four times a day (QID) | INTRAMUSCULAR | Status: DC | PRN

## 2020-04-19 MED ORDER — SODIUM CHLORIDE 0.9 % IV BOLUS
500.0000 mL | Freq: Once | INTRAVENOUS | Status: AC
  Administered 2020-04-19: 500 mL via INTRAVENOUS

## 2020-04-19 MED ORDER — ENOXAPARIN SODIUM 30 MG/0.3ML ~~LOC~~ SOLN
30.0000 mg | SUBCUTANEOUS | Status: DC
  Administered 2020-04-19 – 2020-04-20 (×2): 30 mg via SUBCUTANEOUS
  Filled 2020-04-19 (×3): qty 0.3

## 2020-04-19 MED ORDER — LITHIUM CARBONATE 150 MG PO CAPS
150.0000 mg | ORAL_CAPSULE | Freq: Every day | ORAL | Status: DC
  Filled 2020-04-19: qty 1

## 2020-04-19 MED ORDER — ACETAMINOPHEN 650 MG RE SUPP: 650.0000 mg | Freq: Four times a day (QID) | RECTAL | Status: DC | PRN

## 2020-04-19 MED ORDER — INSULIN ASPART 100 UNIT/ML ~~LOC~~ SOLN: 0.0000 [IU] | Freq: Every day | SUBCUTANEOUS | Status: DC

## 2020-04-19 NOTE — H&P (Signed)
[facility] [place] NAME: Jeremy Le    MR#:  [id number]  DATE OF BIRTH:  1922-07-14  DATE OF ADMISSION:  04/19/2020  PRIMARY CARE PHYSICIAN: Marinda Elk, MD   REQUESTING/REFERRING PHYSICIAN: Dr Delman Kitten  CHIEF COMPLAINT:   Chief Complaint  Patient presents with  . Altered Mental Status    HISTORY OF PRESENT ILLNESS:  Jeremy Le  is a 86 y.o. male brought in with altered mental status. He recently was moved over to [place] assisted living. Today he could not stand. He did not eat. Did not take his medications. The patient's daughter thought he had a UTI starting about 10 days ago but took a while to get the urine analysis and urine culture. Urine culture growing E. coli which is sensitive to Rocephin. Patient has confusion. Patient is hard of hearing but was able to follow commands. Hospitalist services were contacted for further evaluation when his kidney function was worse than usual at 1.7. His calcium was elevated and sodium elevated.  PAST MEDICAL HISTORY:   Past Medical History:  Diagnosis Date  . Angiopathy   . Diabetes mellitus type II, non insulin dependent ([place])   . Diabetic peripheral neuropathy ([place])   . Hammertoe   . Onychomycosis   . Osteoarthritis   . Prostate cancer ([place])     PAST SURGICAL HISTORY:   Past Surgical History:  Procedure Laterality Date  . EYE SURGERY Bilateral   . Lymphnode resection    . PROSTATE SURGERY      SOCIAL HISTORY:   Social History   Tobacco Use  . Smoking status: Former Smoker    Types: Cigarettes  . Smokeless tobacco: Never Used  . Tobacco comment: quit years ago  Substance Use Topics  . Alcohol use: No    FAMILY HISTORY:   Family History  Problem Relation Age of Onset  . Diabetes Mother   . Kidney cancer Father     DRUG ALLERGIES:  No Known Allergies  REVIEW OF SYSTEMS:  Unable to obtain secondary to difficulty hearing.  MEDICATIONS AT HOME:    Prior to Admission medications   Medication Sig Start Date End Date Taking? Authorizing Provider  atorvastatin (LIPITOR) 10 MG tablet Take 10 mg by mouth daily at 6 PM.  05/23/13   [provider]  buPROPion (WELLBUTRIN XL) 150 MG 24 hr tablet Take 150 mg by mouth daily.     [provider]  cephALEXin (KEFLEX) 500 MG capsule  05/03/19   [provider]  gentamicin cream (GARAMYCIN) 0.1 % Apply 1 application topically 2 (two) times daily. 05/06/19   Edrick Kins, DPM  LIFESCAN FINEPOINT LANCETS [place]  04/27/13   [provider]  lithium carbonate 300 MG capsule Take 150 mg by mouth daily.  04/25/13   [provider]  losartan (COZAAR) 25 MG tablet Take 25 mg by mouth daily. 11/10/18   [provider]  Multiple Vitamins-Minerals (CENTRUM SILVER 50+MEN PO) Take 1 tablet by mouth daily.     [provider]  neomycin-polymyxin b-dexamethasone (MAXITROL) 3.5-10000-0.1 OINT Place 1 application into the left eye at bedtime.  02/14/16   [provider]  omeprazole (PRILOSEC) 20 MG capsule Take 20 mg by mouth daily.  05/31/13   [provider]  ONE TOUCH ULTRA TEST test strip  04/25/13   [provider]  polyethylene glycol (MIRALAX / GLYCOLAX) 17 g packet Take 17 g by mouth daily as needed.  [provider]  timolol (TIMOPTIC) 0.5 % ophthalmic solution Place 1 drop into both eyes 2 (two) times daily.  02/15/16   [provider]      VITAL SIGNS:  Blood pressure (!) 117/58, pulse 88, temperature 97.6 F (36.4 C), resp. rate (!) 25, height 5\' 10"  (1.778 m), weight 77.1 kg, SpO2 100 %.  PHYSICAL EXAMINATION:  GENERAL:  86 y.o.-year-old patient lying in the bed with no acute distress.  EYES: Pupils equal, round, reactive to light and accommodation. No scleral icterus. Extraocular muscles intact.  HEENT: Head atraumatic, normocephalic. Oropharynx and nasopharynx clear.  NECK:  Supple LUNGS:  Normal breath sounds bilaterally, no wheezing, rales,rhonchi or crepitation. No use of accessory muscles of respiration.  CARDIOVASCULAR: S1, S2 normal. No murmurs, rubs, or gallops.  ABDOMEN: Soft, nontender, nondistended.  EXTREMITIES: No pedal edema.  NEUROLOGIC: Cranial nerves II through XII are intact. Muscle strength 5/5 in all extremities. Sensation intact. Gait not checked.  PSYCHIATRIC: The patient is alert and oriented x 3.  SKIN: No rash, lesion, or ulcer.   LABORATORY PANEL:   CBC Recent Labs  Lab 04/19/20 0917  WBC 11.8*  HGB 13.2  HCT 40.8  PLT 342   ------------------------------------------------------------------------------------------------------------------  Chemistries  Recent Labs  Lab 04/19/20 0917  NA 148*  K 3.6  CL 110  CO2 22  GLUCOSE 178*  BUN 63*  CREATININE 1.70*  CALCIUM 10.4*  AST 19  ALT 19  ALKPHOS 102  BILITOT 1.6*   ------------------------------------------------------------------------------------------------------------------   IMPRESSION AND PLAN:   1. Acute kidney injury on chronic kidney disease stage IIIb. Start IV fluid hydration. 2. Acute metabolic encephalopathy likely secondary to UTI and dehydration. 3. E. coli UTI. Give IV Rocephin and continue to monitor. Bladder scan after urination. 4. Hypercalcemia likely secondary to dehydration. Will check PTH, PTH RP, TSH and vitamin D 5. Hypernatremia secondary to dehydration. Give IV fluids and check sodium tomorrow morning. 6. Type 2 diabetes mellitus with hyperlipidemia continue pravastatin check a CPK. Put on sliding scale. 7. Mood disorder. Careful with lithium with chronic kidney disease and his age. Lithium level low. Continue Wellbutrin.  All the laboratory and radiological data and old records are reviewed and case discussed with ED provider. Management plans discussed with the patient, family and they are in agreement.  CODE STATUS: DNR  TOTAL TIME TAKING CARE  OF THIS PATIENT: 50 minutes.    Loletha Grayer M.D on 04/19/2020 at 3:14 PM  Between 7am to 6pm - Pager - [phone]  After 6pm call admission pager [phone]  [facility]  CC: Primary care physician; Marinda Elk, MD

## 2020-04-19 NOTE — ED Notes (Addendum)
Pt brief checked for incontinence. Brief clean at this time. Pt repositioned up into bed.

## 2020-04-19 NOTE — ED Notes (Signed)
Pt in recliner from EMS stretcher at this time.

## 2020-04-19 NOTE — ED Notes (Signed)
CBG obtained by this RN and Maureen Ralphs, pt's daughter voicing concerns regarding patient taking injectable insulin due to oral medications tanking CBG in the past. Repeat bladder scan performed due to patient having no urine output through the condom cath. Bladder scan noted to increase from 356 to 380.

## 2020-04-19 NOTE — ED Notes (Signed)
BG 156

## 2020-04-19 NOTE — ED Notes (Signed)
Admitting MD at bedside at this time.

## 2020-04-19 NOTE — ED Provider Notes (Signed)
[facility] Emergency Department Provider Note   ____________________________________________   First MD Initiated Contact with Patient 04/19/20 1447     (approximate)  I have reviewed the triage vital signs and the nursing notes.   HISTORY  Chief Complaint Altered Mental Status  History provided by patient's daughter.  EM caveat: Patient altered mental status confusion  HPI Jeremy Le. is a 86 y.o. male here for evaluation for "UTI"  Patient reports he was diagnosed with a E. coli infection and has his culture data, but was not able to get on antibiotics and also took 10 days to get it diagnosed at his independent living.   He is continue to feel more fatigued, today to the point that he just was unable to get up and walk on his own.  He has had increased confusion for about a week now it seems to be regularly worsening.  No fevers or chills.  He has not complained of any headache he has not appeared short of breath and has been fully vaccinated against COVID-19 without known exposure     Past Medical History:  Diagnosis Date  . Angiopathy   . Diabetes mellitus type II, non insulin dependent ([place])   . Diabetic peripheral neuropathy ([place])   . Hammertoe   . Onychomycosis   . Osteoarthritis   . Prostate cancer [facility])     Patient Active Problem List   Diagnosis Date Noted  . Acute kidney injury ([place]) 04/19/2020  . Bipolar disorder in full remission ([place]) 03/31/2017  . Exudative age-related macular degeneration ([place]) 04/21/2016  . History of mood disorder 03/16/2015  . GERD (gastroesophageal reflux disease) 12/13/2014  . Astigmatism of both eyes 03/07/2012  . Choroidal nevus of left eye 03/07/2012  . POAG (primary open-angle glaucoma) 03/07/2012  . Pseudophakia of both eyes 03/07/2012  . Anemia, unspecified 11/07/2011  . Controlled type 2 diabetes mellitus without complication, without long-term current use of insulin ([place]) 09/19/2011     Past Surgical History:  Procedure Laterality Date  . EYE SURGERY Bilateral   . Lymphnode resection    . PROSTATE SURGERY      Prior to Admission medications   Medication Sig Start Date End Date Taking? Authorizing Provider  atorvastatin (LIPITOR) 10 MG tablet Take 10 mg by mouth daily at 6 PM.  05/23/13   [provider]  buPROPion (WELLBUTRIN XL) 150 MG 24 hr tablet Take 150 mg by mouth daily.     [provider]  cephALEXin (KEFLEX) 500 MG capsule  05/03/19   [provider]  gentamicin cream (GARAMYCIN) 0.1 % Apply 1 application topically 2 (two) times daily. 05/06/19   Edrick Kins, DPM  LIFESCAN FINEPOINT LANCETS [place]  04/27/13   [provider]  lithium carbonate 300 MG capsule Take 150 mg by mouth daily.  04/25/13   [provider]  losartan (COZAAR) 25 MG tablet Take 25 mg by mouth daily. 11/10/18   [provider]  Multiple Vitamins-Minerals (CENTRUM SILVER 50+MEN PO) Take 1 tablet by mouth daily.     [provider]  neomycin-polymyxin b-dexamethasone (MAXITROL) 3.5-10000-0.1 OINT Place 1 application into the left eye at bedtime.  02/14/16   [provider]  omeprazole (PRILOSEC) 20 MG capsule Take 20 mg by mouth daily.  05/31/13   [provider]  ONE TOUCH ULTRA TEST test strip  04/25/13   [provider]  polyethylene glycol (MIRALAX / GLYCOLAX) 17 g packet Take 17 g by  mouth daily as needed.    [provider]  timolol (TIMOPTIC) 0.5 % ophthalmic solution Place 1 drop into both eyes 2 (two) times daily.  02/15/16   [provider]    Allergies Patient has no known allergies.  Family History  Problem Relation Age of Onset  . Diabetes Mother     Social History Social History   Tobacco Use  . Smoking status: Former Smoker    Types: Cigarettes  . Smokeless tobacco: Never Used  . Tobacco comment: quit years ago  Substance Use Topics  . Alcohol use: No  . Drug  use: No    Review of Systems  EM caveat See HPI   ____________________________________________   PHYSICAL EXAM:  VITAL SIGNS: ED Triage Vitals [04/19/20 0848]  Enc Vitals Group     BP 125/64     Pulse Rate 94     Resp (!) 32     Temp 98.5 F (36.9 C)     Temp Source Oral     SpO2 98 %     Weight 170 lb (77.1 kg)     Height 5\' 10"  (1.778 m)     Head Circumference      Peak Flow      Pain Score 0     Pain Loc      Pain Edu?      Excl. in [place]?     Constitutional: Alert very hard of hearing and somewhat confused. Well appearing and in no acute distress.  Moves purposefully, but does not follow more than simple commands. Eyes: Conjunctivae are normal. Head: Atraumatic. Nose: No congestion/rhinnorhea. Mouth/Throat: Mucous membranes are slightly dry. Neck: No stridor.  Cardiovascular: Normal rate, regular rhythm. Grossly normal heart sounds.  Good peripheral circulation. Respiratory: Normal respiratory effort.  No retractions. Lungs CTAB. Gastrointestinal: Soft and nontender. No distention. Musculoskeletal: No lower extremity tenderness nor edema. Neurologic: Very soft-spoken. no gross focal neurologic deficits are appreciated.  Skin:  Skin is warm, dry and intact. No rash noted. Psychiatric: Mood and affect are very calm, perhaps confused.  ____________________________________________   LABS (all labs ordered are listed, but only abnormal results are displayed)  Labs Reviewed  COMPREHENSIVE METABOLIC PANEL - Abnormal; Notable for the following components:      Result Value   Sodium 148 (*)    Glucose, Bld 178 (*)    BUN 63 (*)    Creatinine, Ser 1.70 (*)    Calcium 10.4 (*)    Total Bilirubin 1.6 (*)    GFR calc non Af Amer 33 (*)    GFR calc Af Amer 39 (*)    Anion gap 16 (*)    All other components within normal limits  CBC WITH DIFFERENTIAL/PLATELET - Abnormal; Notable for the following components:   WBC 11.8 (*)    RBC 3.88 (*)    MCV 105.2 (*)     Neutro Abs 10.2 (*)    All other components within normal limits  LITHIUM LEVEL - Abnormal; Notable for the following components:   Lithium Lvl 0.44 (*)    All other components within normal limits  CULTURE, BLOOD (ROUTINE X 2)  CULTURE, BLOOD (ROUTINE X 2)  RESPIRATORY PANEL BY RT PCR (FLU A&B, COVID)  URINE CULTURE  LACTIC ACID, PLASMA  PROTIME-INR  URINALYSIS, COMPLETE (UACMP) WITH MICROSCOPIC  PTH-RELATED PEPTIDE  PARATHYROID HORMONE, INTACT (NO CA)  TSH  VITAMIN D 25 HYDROXY (VIT D DEFICIENCY, FRACTURES)  CK   ____________________________________________  EKG  Reviewed inter by me at 9 AM Heart rate 120 QRs 100 QTc 540 Significant artifact, probable sinus tachycardia.  No evidence of acute ST elevation MI   ____________________________________________  RADIOLOGY  Discussed obtaining CT of the head.  Daughter who is his healthcare power of attorney tells me they did not want any tests like this to not consistent with his goals of care.  He does not have evidence of trauma and there is no report of falls or injuries.  No focal deficits.  With shared medical decision making coming through his daughter who is at the bedside, I think this is quite reasonable.  They report he would not wish for any acute surgical interventions or CT scan of his head ____________________________________________   PROCEDURES  Procedure(s) performed: None  Procedures  Critical Care performed: No  ____________________________________________   INITIAL IMPRESSION / ASSESSMENT AND PLAN / ED COURSE  Pertinent labs & imaging results that were available during my care of the patient were reviewed by me and considered in my medical decision making (see chart for details).   Patient has urine culture demonstrating E. coli with sensitivity to ceftriaxone.  Appears consistent with UTI given the clinical history including foul odor, previous history of similar, altered mental status slow decline  with suspicion for UTI based on history.  Will obtain a UA here as well, but start on antibiotic.  Due to his altered mental status he also appears dehydrated will hydrate, admit to the hospitalist service for further work-up.  Discussed with admitting doctor Dr. Bobbye Charleston  Daughter agreeable with plan     ____________________________________________   FINAL CLINICAL IMPRESSION(S) / ED DIAGNOSES  Final diagnoses:  Altered mental status, unspecified altered mental status type  E. coli urinary tract infection        Note:  This document was prepared using Dragon voice recognition software and may include unintentional dictation errors       Delman Kitten, MD 04/19/20 1456

## 2020-04-19 NOTE — ED Notes (Signed)
Pt's family member leaving bedside for the night.

## 2020-04-19 NOTE — ED Triage Notes (Signed)
Pt comes into the ED via EMS from [facility] with c/o change in mental status, more combative then baseline, results from Urine culture shows ecoli. Pt is bed bound.

## 2020-04-19 NOTE — ED Notes (Signed)
Daughter remains at bedside 

## 2020-04-19 NOTE — ED Notes (Signed)
This RN and Isabella at bedside at this time. Bladder scan performed. Condom cath placed on patient at this time to collect urine sample at this time. Pt's daughter remains at bedside.

## 2020-04-19 NOTE — ED Notes (Signed)
Message sent to pharmacy regarding verifying medications due to patient being admission hold in ED.

## 2020-04-19 NOTE — ED Triage Notes (Signed)
PT arrived via [facility] from [place] with reports of change in mentation, pt recently dx with UTI, culture + for ecoli, had been on antibiotics, but current MAR does not show any antibiotics.  Per EMS, staff reports the patient has been more combative than his baseline.  Pt is bed bound.

## 2020-04-20 ENCOUNTER — Inpatient Hospital Stay: Payer: Medicare Other

## 2020-04-20 DIAGNOSIS — N179 Acute kidney failure, unspecified: Secondary | ICD-10-CM | POA: Diagnosis not present

## 2020-04-20 DIAGNOSIS — N390 Urinary tract infection, site not specified: Secondary | ICD-10-CM | POA: Diagnosis not present

## 2020-04-20 DIAGNOSIS — G9341 Metabolic encephalopathy: Secondary | ICD-10-CM | POA: Diagnosis not present

## 2020-04-20 LAB — BASIC METABOLIC PANEL
Anion gap: 16 — ABNORMAL HIGH (ref 5–15)
BUN: 78 mg/dL — ABNORMAL HIGH (ref 8–23)
CO2: 22 mmol/L (ref 22–32)
Calcium: 9.5 mg/dL (ref 8.9–10.3)
Chloride: 115 mmol/L — ABNORMAL HIGH (ref 98–111)
Creatinine, Ser: 1.91 mg/dL — ABNORMAL HIGH (ref 0.61–1.24)
GFR calc Af Amer: 34 mL/min — ABNORMAL LOW (ref >60)
GFR calc non Af Amer: 29 mL/min — ABNORMAL LOW (ref >60)
Glucose, Bld: 147 mg/dL — ABNORMAL HIGH (ref 70–99)
Potassium: 3.1 mmol/L — ABNORMAL LOW (ref 3.5–5.1)
Sodium: 153 mmol/L — ABNORMAL HIGH (ref 135–145)

## 2020-04-20 LAB — BLOOD CULTURE ID PANEL (REFLEXED) - BCID2

## 2020-04-20 LAB — CBC
HCT: 36.2 % — ABNORMAL LOW (ref 39.0–52.0)
Hemoglobin: 11.6 g/dL — ABNORMAL LOW (ref 13.0–17.0)
MCH: 33.8 pg (ref 26.0–34.0)
MCHC: 32 g/dL (ref 30.0–36.0)
MCV: 105.5 fL — ABNORMAL HIGH (ref 80.0–100.0)
Platelets: 271 10*3/uL (ref 150–400)
RBC: 3.43 MIL/uL — ABNORMAL LOW (ref 4.22–5.81)
RDW: 12.5 % (ref 11.5–15.5)
WBC: 9.8 10*3/uL (ref 4.0–10.5)
nRBC: 0 % (ref 0.0–0.2)

## 2020-04-20 LAB — GLUCOSE, CAPILLARY
Glucose-Capillary: 108 mg/dL — ABNORMAL HIGH (ref 70–99)
Glucose-Capillary: 101 mg/dL — ABNORMAL HIGH (ref 70–99)

## 2020-04-20 LAB — VITAMIN B12: Vitamin B-12: 1503 pg/mL — ABNORMAL HIGH (ref 180–914)

## 2020-04-20 LAB — PARATHYROID HORMONE, INTACT (NO CA): PTH: 44 pg/mL (ref 15–65)

## 2020-04-20 LAB — MRSA PCR SCREENING: MRSA by PCR: NEGATIVE

## 2020-04-20 MED ORDER — SODIUM CHLORIDE 0.9 % IV SOLN: 1.0000 g | INTRAVENOUS | Status: DC

## 2020-04-20 MED ORDER — SODIUM CHLORIDE 0.9 % IV SOLN
2.0000 g | INTRAVENOUS | Status: DC
  Administered 2020-04-20: 2 g via INTRAVENOUS
  Filled 2020-04-20: qty 2
  Filled 2020-04-20: qty 20

## 2020-04-20 MED ORDER — BISACODYL 10 MG RE SUPP
10.0000 mg | Freq: Once | RECTAL | Status: AC
  Administered 2020-04-20: 10 mg via RECTAL
  Filled 2020-04-20 (×2): qty 1

## 2020-04-20 MED ORDER — TAMSULOSIN HCL 0.4 MG PO CAPS: 0.4000 mg | ORAL_CAPSULE | Freq: Every day | ORAL | Status: DC

## 2020-04-20 MED ORDER — SODIUM CHLORIDE 0.45 % IV SOLN: INTRAVENOUS | Status: DC

## 2020-04-20 MED ORDER — POTASSIUM CHLORIDE 10 MEQ/100ML IV SOLN
10.0000 meq | INTRAVENOUS | Status: AC
  Administered 2020-04-20 (×2): 10 meq via INTRAVENOUS
  Filled 2020-04-20: qty 100

## 2020-04-20 MED ORDER — POTASSIUM CHLORIDE CRYS ER 20 MEQ PO TBCR: 40.0000 meq | EXTENDED_RELEASE_TABLET | Freq: Once | ORAL | Status: DC

## 2020-04-20 MED ORDER — PANTOPRAZOLE SODIUM 40 MG IV SOLR
40.0000 mg | Freq: Two times a day (BID) | INTRAVENOUS | Status: DC
  Administered 2020-04-20 – 2020-04-23 (×6): 40 mg via INTRAVENOUS
  Filled 2020-04-20 (×6): qty 40

## 2020-04-20 NOTE — Progress Notes (Signed)
Patient ID: Jeremy Le., male   DOB: 1922/01/14, 86 y.o.   MRN: [id number] [facility] PROGRESS NOTE  Jeremy Le. TKZ:[id number] DOB: Jun 15, 1922 DOA: 04/19/2020 PCP: Marinda Elk, MD  HPI/Subjective: Patient did not eat lunch.  Not the best historian with being hard of hearing.  Did have some abdominal pain when I was palpating.  Objective: Vitals:   04/20/20 0600 04/20/20 0630  BP: (!) 132/59 130/66  Pulse: 76 75  Resp: 20 (!) 25  Temp:    SpO2: 100% 100%    Intake/Output Summary (Last 24 hours) at 04/20/2020 1313 Last data filed at 04/19/2020 1804 Gross per 24 hour  Intake 600 ml  Output 100 ml  Net 500 ml   Filed Weights   04/19/20 0848  Weight: 77.1 kg    ROS: Review of Systems  Respiratory: Positive for shortness of breath.   Cardiovascular: Negative for chest pain.  Gastrointestinal: Positive for abdominal pain. Negative for nausea and vomiting.   Exam: Physical Exam HENT:     Head: Normocephalic.     Mouth/Throat:     Pharynx: No oropharyngeal exudate.  Eyes:     Conjunctiva/sclera: Conjunctivae normal.     Pupils: Pupils are equal, round, and reactive to light.  Cardiovascular:     Rate and Rhythm: Normal rate and regular rhythm.     Heart sounds: Normal heart sounds, S1 normal and S2 normal.  Pulmonary:     Breath sounds: Examination of the right-lower field reveals decreased breath sounds. Examination of the left-lower field reveals decreased breath sounds. Decreased breath sounds present. No wheezing, rhonchi or rales.  Abdominal:     Palpations: Abdomen is soft.     Tenderness: There is generalized abdominal tenderness.  Musculoskeletal:     Right lower leg: No swelling.     Left lower leg: No swelling.  Skin:    General: Skin is warm.     Findings: No rash.     Comments: Fetal little bit warmer than yesterday.  Neurological:     Mental Status: He is alert.       Data Reviewed: Basic Metabolic Panel: Recent Labs  Lab  04/19/20 0917 04/20/20 0315  NA 148* 153*  K 3.6 3.1*  CL 110 115*  CO2 22 22  GLUCOSE 178* 147*  BUN 63* 78*  CREATININE 1.70* 1.91*  CALCIUM 10.4* 9.5   Liver Function Tests: Recent Labs  Lab 04/19/20 0917  AST 19  ALT 19  ALKPHOS 102  BILITOT 1.6*  PROT 7.3  ALBUMIN 3.5   CBC: Recent Labs  Lab 04/19/20 0917 04/20/20 0315  WBC 11.8* 9.8  NEUTROABS 10.2*  --   HGB 13.2 11.6*  HCT 40.8 36.2*  MCV 105.2* 105.5*  PLT 342 271   Cardiac Enzymes: Recent Labs  Lab 04/19/20 0917  CKTOTAL 79    CBG: Recent Labs  Lab 04/19/20 1716 04/19/20 2156  GLUCAP 158* 156*    Recent Results (from the past 240 hour(s))  Culture, blood (Routine x 2)     Status: None (Preliminary result)   Collection Time: 04/19/20  9:17 AM   Specimen: BLOOD LEFT ARM  Result Value Ref Range Status   Specimen Description BLOOD LEFT ARM  Final   Special Requests   Final    BOTTLES DRAWN AEROBIC AND ANAEROBIC Blood Culture results may not be optimal due to an excessive volume of blood received in culture bottles   Culture   Final    NO  GROWTH < 24 HOURS Performed at [facility], [address] [place], [place] [address]    Report Status PENDING  Incomplete  Culture, blood (Routine x 2)     Status: None (Preliminary result)   Collection Time: 04/19/20  2:17 PM   Specimen: BLOOD  Result Value Ref Range Status   Specimen Description BLOOD BLOOD RIGHT ARM  Final   Special Requests   Final    BOTTLES DRAWN AEROBIC AND ANAEROBIC Blood Culture adequate volume   Culture  Setup Time   Final    Organism ID to follow GRAM POSITIVE COCCI AEROBIC BOTTLE ONLY CRITICAL RESULT CALLED TO, READ BACK BY AND VERIFIED WITH: KAREN HAYES AT 1214 04/20/20 [place] Performed at [facility], [address], [place], [place] [address]    Culture GRAM POSITIVE COCCI  Final   Report Status PENDING  Incomplete  Blood Culture ID Panel (Reflexed)     Status: Abnormal   Collection Time:  04/19/20  2:17 PM  Result Value Ref Range Status   Enterococcus faecalis NOT DETECTED NOT DETECTED Final   Enterococcus Faecium NOT DETECTED NOT DETECTED Final   Listeria monocytogenes NOT DETECTED NOT DETECTED Final   Staphylococcus species DETECTED (A) NOT DETECTED Final    Comment: CRITICAL RESULT CALLED TO, READ BACK BY AND VERIFIED WITH: KAREN HAYES AT 3016 04/20/20 SDR    Staphylococcus aureus (BCID) NOT DETECTED NOT DETECTED Final   Staphylococcus epidermidis DETECTED (A) NOT DETECTED Final    Comment: CRITICAL RESULT CALLED TO, READ BACK BY AND VERIFIED WITH:  KAREN HAYES AT 1214 04/20/20 SDR    Staphylococcus lugdunensis NOT DETECTED NOT DETECTED Final   Streptococcus species NOT DETECTED NOT DETECTED Final   Streptococcus agalactiae NOT DETECTED NOT DETECTED Final   Streptococcus pneumoniae NOT DETECTED NOT DETECTED Final   Streptococcus pyogenes NOT DETECTED NOT DETECTED Final   A.calcoaceticus-baumannii NOT DETECTED NOT DETECTED Final   Bacteroides fragilis NOT DETECTED NOT DETECTED Final   Enterobacterales NOT DETECTED NOT DETECTED Final   Enterobacter cloacae complex NOT DETECTED NOT DETECTED Final   Escherichia coli NOT DETECTED NOT DETECTED Final   Klebsiella aerogenes NOT DETECTED NOT DETECTED Final   Klebsiella oxytoca NOT DETECTED NOT DETECTED Final   Klebsiella pneumoniae NOT DETECTED NOT DETECTED Final   Proteus species NOT DETECTED NOT DETECTED Final   Salmonella species NOT DETECTED NOT DETECTED Final   Serratia marcescens NOT DETECTED NOT DETECTED Final   Haemophilus influenzae NOT DETECTED NOT DETECTED Final   Neisseria meningitidis NOT DETECTED NOT DETECTED Final   Pseudomonas aeruginosa NOT DETECTED NOT DETECTED Final   Stenotrophomonas maltophilia NOT DETECTED NOT DETECTED Final   Candida albicans NOT DETECTED NOT DETECTED Final   Candida auris NOT DETECTED NOT DETECTED Final   Candida glabrata NOT DETECTED NOT DETECTED Final   Candida krusei NOT  DETECTED NOT DETECTED Final   Candida parapsilosis NOT DETECTED NOT DETECTED Final   Candida tropicalis NOT DETECTED NOT DETECTED Final   Cryptococcus neoformans/gattii NOT DETECTED NOT DETECTED Final   Methicillin resistance mecA/C NOT DETECTED NOT DETECTED Final    Comment: Performed at [facility], [address] [place],  [address]  Respiratory Panel by RT PCR (Flu A&B, Covid) - Nasopharyngeal Swab     Status: None   Collection Time: 04/19/20  2:47 PM   Specimen: Nasopharyngeal Swab  Result Value Ref Range Status   SARS Coronavirus 2 by RT PCR NEGATIVE NEGATIVE Final    Comment: (NOTE) SARS-CoV-2 target  nucleic acids are NOT DETECTED.  The SARS-CoV-2 RNA is generally detectable in upper respiratoy specimens during the acute phase of infection. The lowest concentration of SARS-CoV-2 viral copies this assay can detect is 131 copies/mL. A negative result does not preclude SARS-Cov-2 infection and should not be used as the sole basis for treatment or other patient management decisions. A negative result may occur with  improper specimen collection/handling, submission of specimen other than nasopharyngeal swab, presence of viral mutation(s) within the areas targeted by this assay, and inadequate number of viral copies (<131 copies/mL). A negative result must be combined with clinical observations, patient history, and epidemiological information. The expected result is Negative.  Fact Sheet for Patients:  PinkCheek.be  Fact Sheet for Healthcare Providers:  GravelBags.it  This test is no t yet approved or cleared by the [place] FDA and  has been authorized for detection and/or diagnosis of SARS-CoV-2 by FDA under an Emergency Use Authorization (EUA). This EUA will remain  in effect (meaning this test can be used) for the duration of the COVID-19 declaration under Section 564(b)(1) of the Act, 21  U.S.C. section 360bbb-3(b)(1), unless the authorization is terminated or revoked sooner.     Influenza A by PCR NEGATIVE NEGATIVE Final   Influenza B by PCR NEGATIVE NEGATIVE Final    Comment: (NOTE) The Xpert Xpress SARS-CoV-2/FLU/RSV assay is intended as an aid in  the diagnosis of influenza from Nasopharyngeal swab specimens and  should not be used as a sole basis for treatment. Nasal washings and  aspirates are unacceptable for Xpert Xpress SARS-CoV-2/FLU/RSV  testing.  Fact Sheet for Patients: PinkCheek.be  Fact Sheet for Healthcare Providers: GravelBags.it  This test is not yet approved or cleared by the [place] FDA and  has been authorized for detection and/or diagnosis of SARS-CoV-2 by  FDA under an Emergency Use Authorization (EUA). This EUA will remain  in effect (meaning this test can be used) for the duration of the  Covid-19 declaration under Section 564(b)(1) of the Act, 21  U.S.C. section 360bbb-3(b)(1), unless the authorization is  terminated or revoked. Performed at [facility], [address] [place], [place] [address]      Studies: CT RENAL STONE STUDY  Result Date: 04/20/2020 CLINICAL DATA:  Flank pain EXAM: CT ABDOMEN AND PELVIS WITHOUT CONTRAST TECHNIQUE: Multidetector CT imaging of the abdomen and pelvis was performed following the standard protocol without oral or IV contrast. COMPARISON:  None. FINDINGS: Lower chest: There is atelectatic change in the lung bases. On axial slice 12 series 4, there is an 8 x 7 mm nodular opacity abutting the pleura in the posterior segment of the left lower lobe. There are foci of coronary artery calcification. Hepatobiliary: There is a cystic area in the posterior dome of the liver on the right measuring 3.5 x 2.6 cm. There is a probable granuloma in the dome of the liver on the right more anteriorly. There is somewhat decreased attenuation in the  right lobe of the liver which may represent fatty infiltration. This area is less than optimally visualized on noncontrast enhanced study. Gallbladder wall is not appreciably thickened. There is no biliary duct dilatation. Pancreas: No pancreatic mass or inflammatory focus evident. A small calcification is noted in the tail of the pancreas. No pancreatic duct dilatation. Spleen: No splenic lesions evident. Adrenals/Urinary Tract: Adrenals bilaterally appear normal. There is an apparent cyst in the upper pole of the left kidney measuring 2.5 x 2.3 cm. There is a mass  arising from the lower pole of the right kidney measuring 3.2 x 2.8 cm which has attenuation values slightly higher than is expected with a cyst. Question complex cyst in this area. No appreciable hydronephrosis on either side. There is a calculus either in or just beyond the right ureterovesical junction measuring 4 x 4 mm in the rightward aspect of the bladder posteriorly. No other ureteral or bladder calculi are apparent on this study. The urinary bladder wall thickness is normal. Stomach/Bowel: There is fairly diffuse stool throughout the colon. There is no appreciable bowel wall thickening or bowel obstruction evident. The terminal ileum appears normal. There is no free air or portal venous air. Vascular/Lymphatic: There is no abdominal aortic aneurysm. There is aortic and iliac artery atherosclerotic calcification. Proximal major mesenteric arterial calcification noted. No adenopathy is appreciable in the abdomen or pelvis. Reproductive: Surgical clips are noted in the region of the prostate. No mass or adenopathy is seen in the area of apparent previous prostatectomy. Surgical clips noted in the pelvic lymph node chain and iliac node chain regions. Other: Periappendiceal region appears unremarkable. No abscess or ascites evident in the abdomen or pelvis. Musculoskeletal: There is degenerative change throughout the lower thoracic and lumbar  regions. No blastic or lytic bone lesions are evident. No intramuscular lesions or abdominal wall lesions evident. IMPRESSION: 1. There is a 4 mm calcification either in or immediately distal to the ureterovesical junction on the right. No associated hydronephrosis. No other findings suggesting ureteral calculus. No intrarenal calculi evident. 2. There is a mass arising from the lower pole the right kidney measuring 3.2 x 2.8 cm which has attenuation values higher than is expected with a cyst. This area may represent a complex cyst. As a cannot be classified confidently as a cyst, it would be prudent to consider MR or CT pre and post-contrast to further evaluate. 3. Diffuse stool throughout colon. No bowel obstruction. No abscess in the abdomen or pelvis. No periappendiceal region inflammation. 4. Evidence of previous prostatectomy and pelvic lymph node dissection. 5. 8 x 7 mm nodular opacity left base posteriorly. Given history of prostate carcinoma, this finding may warrant chest CT in 3 months to assess for stability. 6.  Aortic Atherosclerosis (ICD10-I70.0). Electronically Signed   By: Lowella Grip III M.D.   On: 04/20/2020 12:22    Scheduled Meds:  atorvastatin  10 mg Oral q1800   bisacodyl  10 mg Rectal Once   buPROPion  150 mg Oral Daily   enoxaparin (LOVENOX) injection  30 mg Subcutaneous Q24H   multivitamin with minerals  1 tablet Oral Daily   neomycin-polymyxin b-dexamethasone  1 application Left Eye QHS   pantoprazole (PROTONIX) IV  40 mg Intravenous Q12H   potassium chloride  40 mEq Oral Once   timolol  1 drop Both Eyes BID   Continuous Infusions:  sodium chloride 75 mL/hr at 04/20/20 1134   cefTRIAXone (ROCEPHIN)  IV      Assessment/Plan:  1. Acute kidney injury on chronic kidney disease stage IIIb.  Continue IV fluid hydration.  Creatinine a little bit worse today.  Bladder scan post void was 300 mL.  Continue to monitor at this point.  Start Flomax. 2. E. coli  UTI.  Continue Rocephin and continue to monitor.  CT renal stone protocol did show a stone.  Case discussed with urologist thinks the stone is in the bladder at this point. 3. Acute metabolic encephalopathy secondary to UTI and dehydration. 4. Poor appetite and not eating.  We will get swallow evaluation. 5. Positive blood culture in 1 bottle hopefully this is a contamination.  Continue antibiotics and recheck blood cultures tomorrow. 6. Hypercalcemia likely secondary to dehydration.  Calcium better today after hydration. 7. Hypernatremia secondary to dehydration.  Change fluids over to half-normal saline. 8. Type 2 diabetes mellitus with hyperlipidemia.  On pravastatin.  Family wanted to hold off on sliding scale. 9. Mood disorder.  We will hold lithium with creatinine worsening today.  Continue Wellbutrin if able to take.    Code Status:     Code Status Orders  (From admission, onward)         Start     Ordered   04/19/20 1515  Do not attempt resuscitation (DNR)  Continuous       Question Answer Comment  In the event of cardiac or respiratory ARREST Do not call a code blue   In the event of cardiac or respiratory ARREST Do not perform Intubation, CPR, defibrillation or ACLS   In the event of cardiac or respiratory ARREST Use medication by any route, position, wound care, and other measures to relive pain and suffering. May use oxygen, suction and manual treatment of airway obstruction as needed for comfort.   Comments nurse may pronounce      04/19/20 1515        Code Status History    This patient has a current code status but no historical code status.   Advance Care Planning Activity     Family Communication: Spoke with son on the phone Disposition Plan: Status is: Inpatient  Dispo: The patient is from: Assisted living              Anticipated d/c is to: Assisted living              Anticipated d/c date is: Likely will need a few more days here in the hospital               Patient currently being treated for acute kidney injury, E. coli UTI and acute metabolic encephalopathy  Antibiotics:  Rocephin  Time spent: 28 minutes  [place]

## 2020-04-20 NOTE — Evaluation (Signed)
Clinical/Bedside Swallow Evaluation Patient Details  Name: Abdoulie Tierce. MRN: [id number] Date of Birth: September 20, 1921  Today's Date: 04/20/2020 Time: SLP Start Time (ACUTE ONLY): 4970 SLP Stop Time (ACUTE ONLY): 1425 SLP Time Calculation (min) (ACUTE ONLY): 20 min  Past Medical History:  Past Medical History:  Diagnosis Date  . Angiopathy   . Diabetes mellitus type II, non insulin dependent ([place])   . Diabetic peripheral neuropathy ([place])   . Hammertoe   . Onychomycosis   . Osteoarthritis   . Prostate cancer [facility])    Past Surgical History:  Past Surgical History:  Procedure Laterality Date  . EYE SURGERY Bilateral   . Lymphnode resection    . PROSTATE SURGERY     HPI:  Voris Tigert. is an 86 y.o. male who presented to [facility] on 04/19/2020 with a chief complaint of mental status changes from [place].    Assessment / Plan / Recommendation Clinical Impression  Pt is severely altered and per nursing he has been at this level of mentation all day. Pt with closes half open and his mouth open. He was not responsive to his name, sternal rub, ice cold cloth to his chest and face and only made an "uh" when repositioned in bed. At this time, pt's mentation places his at great risk of aspiration or choking. Recommend NPO until mentation improves. Also recommend Palliative Care consult as chart states pt has been experiencing a decline in PO intake and his mentation has been worsening prior to admission. SLP will follow chart for any improvement in mentation.  SLP Visit Diagnosis: Dysphagia, unspecified (R13.10)    Aspiration Risk  Severe aspiration risk;Risk for inadequate nutrition/hydration    Diet Recommendation NPO   Medication Administration: Via alternative means    Other  Recommendations Oral Care Recommendations: Oral care QID   Follow up Recommendations  (Palliative Care)      Frequency and Duration min 1 x/week  2 weeks       Prognosis Prognosis for Safe Diet  Advancement: Guarded Barriers to Reach Goals: Cognitive deficits;Severity of deficits      Swallow Study   General Date of Onset: 04/19/20 HPI: Drew Lips. is an 86 y.o. male who presented to [facility] on 04/19/2020 with a chief complaint of mental status changes from [place].  Type of Study: Bedside Swallow Evaluation Previous Swallow Assessment: none in chart Diet Prior to this Study: Regular;Thin liquids Temperature Spikes Noted: No Respiratory Status: Room air History of Recent Intubation: No Behavior/Cognition:  (largely not responsive to any stimulation) Oral Cavity Assessment: Dry;Dried secretions Oral Care Completed by SLP: Recent completion by staff Baseline Vocal Quality: Not observed Volitional Cough: Cognitively unable to elicit Volitional Swallow: Unable to elicit    Oral/Motor/Sensory Function Overall Oral Motor/Sensory Function:  (unable to assess, pt with open mouth posture)   Ice Chips Ice chips: Not tested   Thin Liquid Thin Liquid: Not tested    Nectar Thick Nectar Thick Liquid: Not tested   Honey Thick Honey Thick Liquid: Not tested   Puree Puree: Not tested   Solid     Solid: Not tested     Mckinna Demars B. Rutherford Nail, M.S., CCC-SLP, [place] Office [place] 04/20/2020,3:33 PM

## 2020-04-20 NOTE — Progress Notes (Signed)
PHARMACY - PHYSICIAN COMMUNICATION CRITICAL VALUE ALERT - BLOOD CULTURE IDENTIFICATION (BCID)  Jeremy Le. is an 86 y.o. male who presented to [facility] on 04/19/2020 with a chief complaint of mental status changes  Assessment:  Patient with encephalopathy, possibly from dehydration or possible UTI.  Blood culture with GPC in 1/4 bottles, BCID = MSSE.  No cardiac devices, lines, etc., suspect contaminant  Name of physician (or Provider) Contacted: Dr Leslye Peer  Current antibiotics: ceftriaxone  Changes to prescribed antibiotics recommended:  Patient is on recommended antibiotics - No changes needed. Ceftriaxone will have activity against MSSE  Results for orders placed or performed during the hospital encounter of 04/19/20  Blood Culture ID Panel (Reflexed) (Collected: 04/19/2020  2:17 PM)  Result Value Ref Range   Enterococcus faecalis NOT DETECTED NOT DETECTED   Enterococcus Faecium NOT DETECTED NOT DETECTED   Listeria monocytogenes NOT DETECTED NOT DETECTED   Staphylococcus species DETECTED (A) NOT DETECTED   Staphylococcus aureus (BCID) NOT DETECTED NOT DETECTED   Staphylococcus epidermidis DETECTED (A) NOT DETECTED   Staphylococcus lugdunensis NOT DETECTED NOT DETECTED   Streptococcus species NOT DETECTED NOT DETECTED   Streptococcus agalactiae NOT DETECTED NOT DETECTED   Streptococcus pneumoniae NOT DETECTED NOT DETECTED   Streptococcus pyogenes NOT DETECTED NOT DETECTED   A.calcoaceticus-baumannii NOT DETECTED NOT DETECTED   Bacteroides fragilis NOT DETECTED NOT DETECTED   Enterobacterales NOT DETECTED NOT DETECTED   Enterobacter cloacae complex NOT DETECTED NOT DETECTED   Escherichia coli NOT DETECTED NOT DETECTED   Klebsiella aerogenes NOT DETECTED NOT DETECTED   Klebsiella oxytoca NOT DETECTED NOT DETECTED   Klebsiella pneumoniae NOT DETECTED NOT DETECTED   Proteus species NOT DETECTED NOT DETECTED   Salmonella species NOT DETECTED NOT DETECTED   Serratia marcescens  NOT DETECTED NOT DETECTED   Haemophilus influenzae NOT DETECTED NOT DETECTED   Neisseria meningitidis NOT DETECTED NOT DETECTED   Pseudomonas aeruginosa NOT DETECTED NOT DETECTED   Stenotrophomonas maltophilia NOT DETECTED NOT DETECTED   Candida albicans NOT DETECTED NOT DETECTED   Candida auris NOT DETECTED NOT DETECTED   Candida glabrata NOT DETECTED NOT DETECTED   Candida krusei NOT DETECTED NOT DETECTED   Candida parapsilosis NOT DETECTED NOT DETECTED   Candida tropicalis NOT DETECTED NOT DETECTED   Cryptococcus neoformans/gattii NOT DETECTED NOT DETECTED   Methicillin resistance mecA/C NOT DETECTED NOT DETECTED    Doreene Eland, PharmD, BCPS.   Work Cell: [phone] 04/20/2020 1:22 PM

## 2020-04-21 ENCOUNTER — Inpatient Hospital Stay: Payer: Medicare Other

## 2020-04-21 DIAGNOSIS — L8990 Pressure ulcer of unspecified site, unspecified stage: Secondary | ICD-10-CM | POA: Insufficient documentation

## 2020-04-21 DIAGNOSIS — N390 Urinary tract infection, site not specified: Secondary | ICD-10-CM | POA: Diagnosis not present

## 2020-04-21 DIAGNOSIS — L89302 Pressure ulcer of unspecified buttock, stage 2: Secondary | ICD-10-CM

## 2020-04-21 DIAGNOSIS — G9341 Metabolic encephalopathy: Secondary | ICD-10-CM | POA: Diagnosis not present

## 2020-04-21 DIAGNOSIS — N179 Acute kidney failure, unspecified: Secondary | ICD-10-CM | POA: Diagnosis not present

## 2020-04-21 DIAGNOSIS — E870 Hyperosmolality and hypernatremia: Secondary | ICD-10-CM | POA: Diagnosis not present

## 2020-04-21 LAB — GLUCOSE, CAPILLARY
Glucose-Capillary: 92 mg/dL (ref 70–99)
Glucose-Capillary: 84 mg/dL (ref 70–99)
Glucose-Capillary: 78 mg/dL (ref 70–99)
Glucose-Capillary: 63 mg/dL — ABNORMAL LOW (ref 70–99)
Glucose-Capillary: 185 mg/dL — ABNORMAL HIGH (ref 70–99)

## 2020-04-21 LAB — CBC
HCT: 35.7 % — ABNORMAL LOW (ref 39.0–52.0)
Hemoglobin: 12.3 g/dL — ABNORMAL LOW (ref 13.0–17.0)
MCH: 34.7 pg — ABNORMAL HIGH (ref 26.0–34.0)
MCHC: 34.5 g/dL (ref 30.0–36.0)
MCV: 100.8 fL — ABNORMAL HIGH (ref 80.0–100.0)
Platelets: 279 10*3/uL (ref 150–400)
RBC: 3.54 MIL/uL — ABNORMAL LOW (ref 4.22–5.81)
RDW: 12.4 % (ref 11.5–15.5)
WBC: 11.3 10*3/uL — ABNORMAL HIGH (ref 4.0–10.5)
nRBC: 0 % (ref 0.0–0.2)

## 2020-04-21 LAB — BASIC METABOLIC PANEL
Anion gap: 13 (ref 5–15)
BUN: 59 mg/dL — ABNORMAL HIGH (ref 8–23)
CO2: 21 mmol/L — ABNORMAL LOW (ref 22–32)
Calcium: 10 mg/dL (ref 8.9–10.3)
Chloride: 119 mmol/L — ABNORMAL HIGH (ref 98–111)
Creatinine, Ser: 1.45 mg/dL — ABNORMAL HIGH (ref 0.61–1.24)
GFR calc Af Amer: 47 mL/min — ABNORMAL LOW (ref >60)
GFR calc non Af Amer: 40 mL/min — ABNORMAL LOW (ref >60)
Glucose, Bld: 110 mg/dL — ABNORMAL HIGH (ref 70–99)
Potassium: 3.4 mmol/L — ABNORMAL LOW (ref 3.5–5.1)
Sodium: 153 mmol/L — ABNORMAL HIGH (ref 135–145)

## 2020-04-21 LAB — CULTURE, BLOOD (ROUTINE X 2): Special Requests: ADEQUATE

## 2020-04-21 LAB — URINE CULTURE: Culture: >=100000 — AB

## 2020-04-21 MED ORDER — DEXTROSE-NACL 5-0.45 % IV SOLN: INTRAVENOUS | Status: DC

## 2020-04-21 MED ORDER — POTASSIUM CHLORIDE 10 MEQ/100ML IV SOLN
10.0000 meq | INTRAVENOUS | Status: AC
  Administered 2020-04-21 (×2): 10 meq via INTRAVENOUS
  Filled 2020-04-21: qty 100

## 2020-04-21 MED ORDER — CEFAZOLIN SODIUM-DEXTROSE 2-4 GM/100ML-% IV SOLN
2.0000 g | Freq: Three times a day (TID) | INTRAVENOUS | Status: DC
  Administered 2020-04-21 – 2020-04-23 (×6): 2 g via INTRAVENOUS
  Filled 2020-04-21 (×10): qty 100

## 2020-04-21 MED ORDER — DEXTROSE 50 % IV SOLN
INTRAVENOUS | Status: AC
  Filled 2020-04-21: qty 50

## 2020-04-21 MED ORDER — DEXTROSE 50 % IV SOLN
50.0000 mL | Freq: Once | INTRAVENOUS | Status: AC
  Administered 2020-04-21: 50 mL via INTRAVENOUS

## 2020-04-21 MED ORDER — ENOXAPARIN SODIUM 40 MG/0.4ML ~~LOC~~ SOLN
40.0000 mg | SUBCUTANEOUS | Status: DC
  Administered 2020-04-21 – 2020-04-22 (×2): 40 mg via SUBCUTANEOUS
  Filled 2020-04-21 (×2): qty 0.4

## 2020-04-21 NOTE — Progress Notes (Signed)
PHARMACIST - PHYSICIAN COMMUNICATION  CONCERNING:  Enoxaparin (Lovenox) for DVT Prophylaxis    RECOMMENDATION: Patient was prescribed enoxaprin 30mg  q24 hours for VTE prophylaxis and CrCl <19ml/min.   Filed Weights   04/19/20 0848  Weight: 77.1 kg (170 lb)    Body mass index is 24.39 kg/m.  Estimated Creatinine Clearance: 30.8 mL/min (A) (by C-G formula based on SCr of 1.45 mg/dL (H)).   Based on [place] patient is candidate for enoxaparin 40mg  every 24 hours based on CrCl >37ml/min.  DESCRIPTION: Pharmacy has adjusted enoxaparin dose per [facility] policy.  Patient is now receiving enoxaparin  40mg  every 24 hours    Pernell Dupre, PharmD, BCPS Clinical Pharmacist 04/21/2020 8:20 AM

## 2020-04-21 NOTE — Progress Notes (Signed)
SLP Cancellation Note  Patient Details Name: Jeremy Le. MRN: [id number] DOB: [birth year]   Cancelled treatment:       Reason Eval/Treat Not Completed: Medical issues which prohibited therapy   Pt continues to be obtunded. Please re-consult when pt is appropriate.   Enyla Lisbon B. Rutherford Nail M.S., CCC-SLP, [place] Office [phone]      Stormy Fabian 04/21/2020, 11:56 AM

## 2020-04-21 NOTE — Progress Notes (Signed)
Patient ID: Jeremy Le., male   DOB: Jun 07, 1922, 86 y.o.   MRN: [id number] [facility] PROGRESS NOTE  Jeremy Le. WUX:[id number] DOB: 1921/09/16 DOA: 04/19/2020 PCP: Marinda Elk, MD  HPI/Subjective: Patient seen early this morning and was sleeping hard.  Late morning he opened his eyes but went right back to sleep.  Came in with acute kidney injury and acute metabolic encephalopathy.  Objective: Vitals:   04/21/20 0734 04/21/20 1134  BP: (!) 165/59 (!) 153/62  Pulse: 85 75  Resp: 20 16  Temp: 97.9 F (36.6 C) 97.6 F (36.4 C)  SpO2: 100% 99%    Intake/Output Summary (Last 24 hours) at 04/21/2020 1259 Last data filed at 04/21/2020 0430 Gross per 24 hour  Intake 1106.68 ml  Output 1400 ml  Net -293.32 ml   Filed Weights   04/19/20 0848  Weight: 77.1 kg    ROS: Review of Systems  Unable to perform ROS: Acuity of condition   Exam: Physical Exam HENT:     Head: Normocephalic.     Mouth/Throat:     Pharynx: No oropharyngeal exudate.  Eyes:     General: Lids are normal.     Conjunctiva/sclera: Conjunctivae normal.  Cardiovascular:     Rate and Rhythm: Normal rate and regular rhythm.     Heart sounds: Normal heart sounds, S1 normal and S2 normal.  Pulmonary:     Breath sounds: No decreased breath sounds, wheezing, rhonchi or rales.  Abdominal:     Palpations: Abdomen is soft.     Tenderness: There is no abdominal tenderness.  Musculoskeletal:     Right lower leg: No swelling.     Left lower leg: No swelling.  Skin:    General: Skin is warm.     Findings: No rash.  Neurological:     Mental Status: He is lethargic.       Data Reviewed: Basic Metabolic Panel: Recent Labs  Lab 04/19/20 0917 04/20/20 0315 04/21/20 0402  NA 148* 153* 153*  K 3.6 3.1* 3.4*  CL 110 115* 119*  CO2 22 22 21*  GLUCOSE 178* 147* 110*  BUN 63* 78* 59*  CREATININE 1.70* 1.91* 1.45*  CALCIUM 10.4* 9.5 10.0   Liver Function Tests: Recent Labs  Lab  04/19/20 0917  AST 19  ALT 19  ALKPHOS 102  BILITOT 1.6*  PROT 7.3  ALBUMIN 3.5   No results for input(s): LIPASE, AMYLASE in the last 168 hours. No results for input(s): AMMONIA in the last 168 hours. CBC: Recent Labs  Lab 04/19/20 0917 04/20/20 0315 04/21/20 0402  WBC 11.8* 9.8 11.3*  NEUTROABS 10.2*  --   --   HGB 13.2 11.6* 12.3*  HCT 40.8 36.2* 35.7*  MCV 105.2* 105.5* 100.8*  PLT 342 271 279   Cardiac Enzymes: Recent Labs  Lab 04/19/20 0917  CKTOTAL 79    CBG: Recent Labs  Lab 04/19/20 2156 04/20/20 1815 04/20/20 2109 04/21/20 0736 04/21/20 1135  GLUCAP 156* 108* 101* 92 84    Recent Results (from the past 240 hour(s))  Culture, blood (Routine x 2)     Status: None (Preliminary result)   Collection Time: 04/19/20  9:17 AM   Specimen: BLOOD LEFT ARM  Result Value Ref Range Status   Specimen Description BLOOD LEFT ARM  Final   Special Requests   Final    BOTTLES DRAWN AEROBIC AND ANAEROBIC Blood Culture results may not be optimal due to an excessive volume of blood received in  culture bottles   Culture   Final    NO GROWTH 2 DAYS Performed at [facility], [address] [place], [place] [address]    Report Status PENDING  Incomplete  Culture, blood (Routine x 2)     Status: Abnormal   Collection Time: 04/19/20  2:17 PM   Specimen: BLOOD  Result Value Ref Range Status   Specimen Description   Final    BLOOD BLOOD RIGHT ARM Performed at [facility], [address] [place], [place] [address]    Special Requests   Final    BOTTLES DRAWN AEROBIC AND ANAEROBIC Blood Culture adequate volume Performed at [facility], [address] [place], [place] [address]    Culture  Setup Time   Final    GRAM POSITIVE COCCI AEROBIC BOTTLE ONLY CRITICAL RESULT CALLED TO, READ BACK BY AND VERIFIED WITH: KAREN HAYES AT 1214 04/20/20 SDR    Culture (A)  Final    STAPHYLOCOCCUS EPIDERMIDIS THE SIGNIFICANCE OF ISOLATING THIS  ORGANISM FROM A SINGLE SET OF BLOOD CULTURES WHEN MULTIPLE SETS ARE DRAWN IS UNCERTAIN. PLEASE NOTIFY THE MICROBIOLOGY DEPARTMENT WITHIN ONE WEEK IF SPECIATION AND SENSITIVITIES ARE REQUIRED. Performed at [facility], [place] [address], [place], [place] [address]    Report Status 04/21/2020 FINAL  Final  Blood Culture ID Panel (Reflexed)     Status: Abnormal   Collection Time: 04/19/20  2:17 PM  Result Value Ref Range Status   Enterococcus faecalis NOT DETECTED NOT DETECTED Final   Enterococcus Faecium NOT DETECTED NOT DETECTED Final   Listeria monocytogenes NOT DETECTED NOT DETECTED Final   Staphylococcus species DETECTED (A) NOT DETECTED Final    Comment: CRITICAL RESULT CALLED TO, READ BACK BY AND VERIFIED WITH: KAREN HAYES AT 1214 04/20/20 SDR    Staphylococcus aureus (BCID) NOT DETECTED NOT DETECTED Final   Staphylococcus epidermidis DETECTED (A) NOT DETECTED Final    Comment: CRITICAL RESULT CALLED TO, READ BACK BY AND VERIFIED WITH:  KAREN HAYES AT 1214 04/20/20 SDR    Staphylococcus lugdunensis NOT DETECTED NOT DETECTED Final   Streptococcus species NOT DETECTED NOT DETECTED Final   Streptococcus agalactiae NOT DETECTED NOT DETECTED Final   Streptococcus pneumoniae NOT DETECTED NOT DETECTED Final   Streptococcus pyogenes NOT DETECTED NOT DETECTED Final   A.calcoaceticus-baumannii NOT DETECTED NOT DETECTED Final   Bacteroides fragilis NOT DETECTED NOT DETECTED Final   Enterobacterales NOT DETECTED NOT DETECTED Final   Enterobacter cloacae complex NOT DETECTED NOT DETECTED Final   Escherichia coli NOT DETECTED NOT DETECTED Final   Klebsiella aerogenes NOT DETECTED NOT DETECTED Final   Klebsiella oxytoca NOT DETECTED NOT DETECTED Final   Klebsiella pneumoniae NOT DETECTED NOT DETECTED Final   Proteus species NOT DETECTED NOT DETECTED Final   Salmonella species NOT DETECTED NOT DETECTED Final   Serratia marcescens NOT DETECTED NOT DETECTED Final   Haemophilus influenzae NOT  DETECTED NOT DETECTED Final   Neisseria meningitidis NOT DETECTED NOT DETECTED Final   Pseudomonas aeruginosa NOT DETECTED NOT DETECTED Final   Stenotrophomonas maltophilia NOT DETECTED NOT DETECTED Final   Candida albicans NOT DETECTED NOT DETECTED Final   Candida auris NOT DETECTED NOT DETECTED Final   Candida glabrata NOT DETECTED NOT DETECTED Final   Candida krusei NOT DETECTED NOT DETECTED Final   Candida parapsilosis NOT DETECTED NOT DETECTED Final   Candida tropicalis NOT DETECTED NOT DETECTED Final   Cryptococcus neoformans/gattii NOT DETECTED NOT DETECTED Final   Methicillin resistance mecA/C NOT  DETECTED NOT DETECTED Final    Comment: Performed at [facility], [address] [place], [place] [address]  Respiratory Panel by RT PCR (Flu A&B, Covid) - Nasopharyngeal Swab     Status: None   Collection Time: 04/19/20  2:47 PM   Specimen: Nasopharyngeal Swab  Result Value Ref Range Status   SARS Coronavirus 2 by RT PCR NEGATIVE NEGATIVE Final    Comment: (NOTE) SARS-CoV-2 target nucleic acids are NOT DETECTED.  The SARS-CoV-2 RNA is generally detectable in upper respiratoy specimens during the acute phase of infection. The lowest concentration of SARS-CoV-2 viral copies this assay can detect is 131 copies/mL. A negative result does not preclude SARS-Cov-2 infection and should not be used as the sole basis for treatment or other patient management decisions. A negative result may occur with  improper specimen collection/handling, submission of specimen other than nasopharyngeal swab, presence of viral mutation(s) within the areas targeted by this assay, and inadequate number of viral copies (<131 copies/mL). A negative result must be combined with clinical observations, patient history, and epidemiological information. The expected result is Negative.  Fact Sheet for Patients:  PinkCheek.be  Fact Sheet for Healthcare Providers:   GravelBags.it  This test is no t yet approved or cleared by the [place] FDA and  has been authorized for detection and/or diagnosis of SARS-CoV-2 by FDA under an Emergency Use Authorization (EUA). This EUA will remain  in effect (meaning this test can be used) for the duration of the COVID-19 declaration under Section 564(b)(1) of the Act, 21 U.S.C. section 360bbb-3(b)(1), unless the authorization is terminated or revoked sooner.     Influenza A by PCR NEGATIVE NEGATIVE Final   Influenza B by PCR NEGATIVE NEGATIVE Final    Comment: (NOTE) The Xpert Xpress SARS-CoV-2/FLU/RSV assay is intended as an aid in  the diagnosis of influenza from Nasopharyngeal swab specimens and  should not be used as a sole basis for treatment. Nasal washings and  aspirates are unacceptable for Xpert Xpress SARS-CoV-2/FLU/RSV  testing.  Fact Sheet for Patients: PinkCheek.be  Fact Sheet for Healthcare Providers: GravelBags.it  This test is not yet approved or cleared by the [place] FDA and  has been authorized for detection and/or diagnosis of SARS-CoV-2 by  FDA under an Emergency Use Authorization (EUA). This EUA will remain  in effect (meaning this test can be used) for the duration of the  Covid-19 declaration under Section 564(b)(1) of the Act, 21  U.S.C. section 360bbb-3(b)(1), unless the authorization is  terminated or revoked. Performed at [facility], [address], [place], [place] [address]   Urine Culture     Status: Abnormal   Collection Time: 04/19/20  2:47 PM   Specimen: Urine, Random  Result Value Ref Range Status   Specimen Description   Final    URINE, RANDOM Performed at [facility], [address] [place], [place] [address]    Special Requests   Final    NONE Performed at [facility], [address] [place],  [address]    Culture  >=100,000 COLONIES/mL ESCHERICHIA COLI (A)  Final   Report Status 04/21/2020 FINAL  Final   Organism ID, Bacteria ESCHERICHIA COLI (A)  Final      Susceptibility   Escherichia coli - MIC*    AMPICILLIN >=32 RESISTANT Resistant     CEFAZOLIN <=4 SENSITIVE Sensitive     CEFTRIAXONE <=0.25 SENSITIVE Sensitive     CIPROFLOXACIN >=4 RESISTANT Resistant  GENTAMICIN <=1 SENSITIVE Sensitive     IMIPENEM <=0.25 SENSITIVE Sensitive     NITROFURANTOIN <=16 SENSITIVE Sensitive     TRIMETH/SULFA <=20 SENSITIVE Sensitive     AMPICILLIN/SULBACTAM 16 INTERMEDIATE Intermediate     PIP/TAZO <=4 SENSITIVE Sensitive     * >=100,000 COLONIES/mL ESCHERICHIA COLI  MRSA PCR Screening     Status: None   Collection Time: 04/20/20  4:49 PM   Specimen: Nasopharyngeal  Result Value Ref Range Status   MRSA by PCR NEGATIVE NEGATIVE Final    Comment:        The GeneXpert MRSA Assay (FDA approved for NASAL specimens only), is one component of a comprehensive MRSA colonization surveillance program. It is not intended to diagnose MRSA infection nor to guide or monitor treatment for MRSA infections. Performed at [facility], [address], [place], [place] [address]      Studies: CT HEAD WO CONTRAST  Result Date: 04/21/2020 CLINICAL DATA:  Unresponsive. EXAM: CT HEAD WITHOUT CONTRAST TECHNIQUE: Contiguous axial images were obtained from the base of the skull through the vertex without intravenous contrast. COMPARISON:  11/23/2018 FINDINGS: Brain: There is no evidence of an acute infarct, intracranial hemorrhage, midline shift, or extra-axial fluid collection. Patchy hypodensities in the cerebral white matter bilaterally have mildly progressed and are nonspecific but compatible with mild-to-moderate chronic small vessel ischemic disease. Moderate cerebral atrophy has also likely slightly progressed. A 1.1 cm calcified extra-axial mass at the vertex to the right of the falx and superior sagittal sinus  is unchanged and likely reflects a small meningioma. Vascular: Calcified atherosclerosis at the skull base. No hyperdense vessel. Skull: No fracture or suspicious osseous lesion. Sinuses/Orbits: Visualized paranasal sinuses and mastoid air cells are clear. Bilateral cataract extraction. Other: None. IMPRESSION: 1. No evidence of acute intracranial abnormality. 2. Mild-to-moderate chronic small vessel ischemic disease and cerebral atrophy. 3. Unchanged 1.1 cm right parafalcine meningioma. Electronically Signed   By: Logan Bores M.D.   On: 04/21/2020 09:09   CT RENAL STONE STUDY  Result Date: 04/20/2020 CLINICAL DATA:  Flank pain EXAM: CT ABDOMEN AND PELVIS WITHOUT CONTRAST TECHNIQUE: Multidetector CT imaging of the abdomen and pelvis was performed following the standard protocol without oral or IV contrast. COMPARISON:  None. FINDINGS: Lower chest: There is atelectatic change in the lung bases. On axial slice 12 series 4, there is an 8 x 7 mm nodular opacity abutting the pleura in the posterior segment of the left lower lobe. There are foci of coronary artery calcification. Hepatobiliary: There is a cystic area in the posterior dome of the liver on the right measuring 3.5 x 2.6 cm. There is a probable granuloma in the dome of the liver on the right more anteriorly. There is somewhat decreased attenuation in the right lobe of the liver which may represent fatty infiltration. This area is less than optimally visualized on noncontrast enhanced study. Gallbladder wall is not appreciably thickened. There is no biliary duct dilatation. Pancreas: No pancreatic mass or inflammatory focus evident. A small calcification is noted in the tail of the pancreas. No pancreatic duct dilatation. Spleen: No splenic lesions evident. Adrenals/Urinary Tract: Adrenals bilaterally appear normal. There is an apparent cyst in the upper pole of the left kidney measuring 2.5 x 2.3 cm. There is a mass arising from the lower pole of the  right kidney measuring 3.2 x 2.8 cm which has attenuation values slightly higher than is expected with a cyst. Question complex cyst in this area. No appreciable hydronephrosis  on either side. There is a calculus either in or just beyond the right ureterovesical junction measuring 4 x 4 mm in the rightward aspect of the bladder posteriorly. No other ureteral or bladder calculi are apparent on this study. The urinary bladder wall thickness is normal. Stomach/Bowel: There is fairly diffuse stool throughout the colon. There is no appreciable bowel wall thickening or bowel obstruction evident. The terminal ileum appears normal. There is no free air or portal venous air. Vascular/Lymphatic: There is no abdominal aortic aneurysm. There is aortic and iliac artery atherosclerotic calcification. Proximal major mesenteric arterial calcification noted. No adenopathy is appreciable in the abdomen or pelvis. Reproductive: Surgical clips are noted in the region of the prostate. No mass or adenopathy is seen in the area of apparent previous prostatectomy. Surgical clips noted in the pelvic lymph node chain and iliac node chain regions. Other: Periappendiceal region appears unremarkable. No abscess or ascites evident in the abdomen or pelvis. Musculoskeletal: There is degenerative change throughout the lower thoracic and lumbar regions. No blastic or lytic bone lesions are evident. No intramuscular lesions or abdominal wall lesions evident. IMPRESSION: 1. There is a 4 mm calcification either in or immediately distal to the ureterovesical junction on the right. No associated hydronephrosis. No other findings suggesting ureteral calculus. No intrarenal calculi evident. 2. There is a mass arising from the lower pole the right kidney measuring 3.2 x 2.8 cm which has attenuation values higher than is expected with a cyst. This area may represent a complex cyst. As a cannot be classified confidently as a cyst, it would be prudent to  consider MR or CT pre and post-contrast to further evaluate. 3. Diffuse stool throughout colon. No bowel obstruction. No abscess in the abdomen or pelvis. No periappendiceal region inflammation. 4. Evidence of previous prostatectomy and pelvic lymph node dissection. 5. 8 x 7 mm nodular opacity left base posteriorly. Given history of prostate carcinoma, this finding may warrant chest CT in 3 months to assess for stability. 6.  Aortic Atherosclerosis (ICD10-I70.0). Electronically Signed   By: Lowella Grip III M.D.   On: 04/20/2020 12:22    Scheduled Meds: . buPROPion  150 mg Oral Daily  . enoxaparin (LOVENOX) injection  40 mg Subcutaneous Q24H  . multivitamin with minerals  1 tablet Oral Daily  . neomycin-polymyxin b-dexamethasone  1 application Left Eye QHS  . pantoprazole (PROTONIX) IV  40 mg Intravenous Q12H  . tamsulosin  0.4 mg Oral Daily  . timolol  1 drop Both Eyes BID   Continuous Infusions: . sodium chloride 75 mL/hr at 04/21/20 0352  .  ceFAZolin (ANCEF) IV    . potassium chloride 10 mEq (04/21/20 1207)    Assessment/Plan:  1. Acute metabolic encephalopathy.  Mental status has not improved.  Repeat CT scan of the head today does not show any acute finding.  Does have a meningioma which is chronic and chronic microvascular ischemic changes.  Patient not alert enough to eat and is n.p.o.  If mental status does not improve then may consider hospice.  The patient's mental status has declined over the last 3 weeks. 2. E. coli UTI.  Rocephin switched over to Ancef.  Likely had a kidney stone that is now in the bladder. 3. Positive blood culture 1 bottle with staph epidermidis.  Likely contamination.  Ancef would cover. 4. Acute kidney injury on chronic kidney disease stage IIIb.  Creatinine improved today down to 1.45. 5. Hypernatremia secondary to dehydration.  Sodium 153 today.  Continue IV fluids. 6. Type 2 diabetes mellitus with hyperlipidemia.  Pravastatin on hold.  Sliding scale  on hold. 7. Hypercalcemia improved with IV fluids. 8. Mood disorder.  Holding lithium and Wellbutrin because NPO. 9. Stage II buttock decubitus, present on admission.  See description below  Pressure Injury 04/20/20 Buttocks Mid Stage 2 -  Partial thickness loss of dermis presenting as a shallow open injury with a red, pink wound bed without slough. (Active)  04/20/20 1630  Location: Buttocks  Location Orientation: Mid  Staging: Stage 2 -  Partial thickness loss of dermis presenting as a shallow open injury with a red, pink wound bed without slough.  Wound Description (Comments):   Present on Admission: Yes       Code Status:     Code Status Orders  (From admission, onward)         Start     Ordered   04/19/20 1515  Do not attempt resuscitation (DNR)  Continuous       Question Answer Comment  In the event of cardiac or respiratory ARREST Do not call a "code blue"   In the event of cardiac or respiratory ARREST Do not perform Intubation, CPR, defibrillation or ACLS   In the event of cardiac or respiratory ARREST Use medication by any route, position, wound care, and other measures to relive pain and suffering. May use oxygen, suction and manual treatment of airway obstruction as needed for comfort.   Comments nurse may pronounce      04/19/20 1515        Code Status History    This patient has a current code status but no historical code status.   Advance Care Planning Activity     Family Communication: Daughter at the bedside Disposition Plan: Status is: Inpatient  Dispo: The patient is from: Assisted living              Anticipated d/c is to: Unclear at this point              Anticipated d/c date is: We will need to regain mental status in order to have a disposition              Patient currently with worsening mental status.  If does not regain mental status may end up requiring hospice.  Antibiotics: Ancef  Time spent: 28 minutes  [place]

## 2020-04-21 NOTE — Progress Notes (Signed)
POCT 63 NP notified. NP to place orders.

## 2020-04-21 NOTE — Progress Notes (Signed)
PT Cancellation Note  Patient Details Name: Jeremy Le. MRN: [id number] DOB: [birth year]   Cancelled Treatment:    Reason Eval/Treat Not Completed: Patient's level of consciousness (patient is too drowsy/obtunded to participate. Groaned with sternal rub, sleeping with eyes closed and mouth open. Did open eyes and move legs a little while speaking with daughter, but closed them and stopped responding when attempted to speak with him. Will re-attempt at later time/dated)   [name]. Graylon Good, PT, DPT 04/21/20, 12:58 PM

## 2020-04-22 ENCOUNTER — Inpatient Hospital Stay: Payer: Medicare Other

## 2020-04-22 DIAGNOSIS — E870 Hyperosmolality and hypernatremia: Secondary | ICD-10-CM | POA: Diagnosis not present

## 2020-04-22 DIAGNOSIS — N390 Urinary tract infection, site not specified: Secondary | ICD-10-CM | POA: Diagnosis not present

## 2020-04-22 DIAGNOSIS — G9341 Metabolic encephalopathy: Secondary | ICD-10-CM | POA: Diagnosis not present

## 2020-04-22 DIAGNOSIS — N179 Acute kidney failure, unspecified: Secondary | ICD-10-CM | POA: Diagnosis not present

## 2020-04-22 DIAGNOSIS — E876 Hypokalemia: Secondary | ICD-10-CM

## 2020-04-22 LAB — GLUCOSE, CAPILLARY
Glucose-Capillary: 206 mg/dL — ABNORMAL HIGH (ref 70–99)
Glucose-Capillary: 217 mg/dL — ABNORMAL HIGH (ref 70–99)
Glucose-Capillary: 213 mg/dL — ABNORMAL HIGH (ref 70–99)
Glucose-Capillary: 197 mg/dL — ABNORMAL HIGH (ref 70–99)

## 2020-04-22 LAB — COMPREHENSIVE METABOLIC PANEL
ALT: 15 U/L (ref 0–44)
AST: 18 U/L (ref 15–41)
Albumin: 2.8 g/dL — ABNORMAL LOW (ref 3.5–5.0)
Alkaline Phosphatase: 75 U/L (ref 38–126)
Anion gap: 9 (ref 5–15)
BUN: 39 mg/dL — ABNORMAL HIGH (ref 8–23)
CO2: 26 mmol/L (ref 22–32)
Calcium: 9.4 mg/dL (ref 8.9–10.3)
Chloride: 118 mmol/L — ABNORMAL HIGH (ref 98–111)
Creatinine, Ser: 1.24 mg/dL (ref 0.61–1.24)
GFR calc Af Amer: 57 mL/min — ABNORMAL LOW (ref >60)
GFR calc non Af Amer: 49 mL/min — ABNORMAL LOW (ref >60)
Glucose, Bld: 226 mg/dL — ABNORMAL HIGH (ref 70–99)
Potassium: 3.2 mmol/L — ABNORMAL LOW (ref 3.5–5.1)
Sodium: 153 mmol/L — ABNORMAL HIGH (ref 135–145)
Total Bilirubin: 1 mg/dL (ref 0.3–1.2)
Total Protein: 6.1 g/dL — ABNORMAL LOW (ref 6.5–8.1)

## 2020-04-22 LAB — CBC
HCT: 35.2 % — ABNORMAL LOW (ref 39.0–52.0)
Hemoglobin: 12.1 g/dL — ABNORMAL LOW (ref 13.0–17.0)
MCH: 34 pg (ref 26.0–34.0)
MCHC: 34.4 g/dL (ref 30.0–36.0)
MCV: 98.9 fL (ref 80.0–100.0)
Platelets: 268 10*3/uL (ref 150–400)
RBC: 3.56 MIL/uL — ABNORMAL LOW (ref 4.22–5.81)
RDW: 12.4 % (ref 11.5–15.5)
WBC: 10.7 10*3/uL — ABNORMAL HIGH (ref 4.0–10.5)
nRBC: 0 % (ref 0.0–0.2)

## 2020-04-22 LAB — VITAMIN B12: Vitamin B-12: 1617 pg/mL — ABNORMAL HIGH (ref 180–914)

## 2020-04-22 MED ORDER — POTASSIUM CL IN DEXTROSE 5% 20 MEQ/L IV SOLN
20.0000 meq | INTRAVENOUS | Status: DC
  Administered 2020-04-22 – 2020-04-23 (×2): 20 meq via INTRAVENOUS
  Filled 2020-04-22 (×4): qty 1000

## 2020-04-22 NOTE — Progress Notes (Signed)
Patient ID: Jeremy Cornfield., male   DOB: [birth year], 86 y.o.   MRN: [id number] [facility] PROGRESS NOTE  Jeremy Cornfield. WYO:[id number] DOB: March 05, 1922 DOA: 04/19/2020 PCP: Marinda Elk, MD  HPI/Subjective: Patient slightly more alert today.  When I saw him this morning he shook his head when I said are you hungry.  No pain.  He was able to lift up his arms up off the bed.  Came in with acute metabolic encephalopathy and UTI.  Objective: Vitals:   04/22/20 0802 04/22/20 1159  BP: 138/71 (!) 117/54  Pulse: 91 71  Resp: 20 20  Temp: 98.3 F (36.8 C) 98.4 F (36.9 C)  SpO2: 99% 99%    Intake/Output Summary (Last 24 hours) at 04/22/2020 1346 Last data filed at 04/22/2020 7741 Gross per 24 hour  Intake --  Output 1050 ml  Net -1050 ml   Filed Weights   04/19/20 0848  Weight: 77.1 kg    ROS: Review of Systems  Unable to perform ROS: Acuity of condition  Respiratory: Negative for shortness of breath.   Cardiovascular: Negative for chest pain.  Gastrointestinal: Negative for abdominal pain.   Exam: Physical Exam HENT:     Mouth/Throat:     Mouth: Mucous membranes are dry.     Pharynx: No oropharyngeal exudate.  Eyes:     General: Lids are normal.     Conjunctiva/sclera: Conjunctivae normal.  Cardiovascular:     Rate and Rhythm: Normal rate and regular rhythm.     Heart sounds: Normal heart sounds, S1 normal and S2 normal.  Pulmonary:     Breath sounds: No decreased breath sounds, wheezing, rhonchi or rales.  Abdominal:     Palpations: Abdomen is soft.     Tenderness: There is no abdominal tenderness.  Musculoskeletal:     Right lower leg: Swelling present.     Left lower leg: Swelling present.  Skin:    General: Skin is warm.     Findings: No rash.  Neurological:     Comments: More alert today.  Able to follow some simple commands like lifting his arms up off the bed.       Data Reviewed: Basic Metabolic Panel: Recent Labs  Lab 04/19/20 0917  04/20/20 0315 04/21/20 0402 04/22/20 0523  NA 148* 153* 153* 153*  K 3.6 3.1* 3.4* 3.2*  CL 110 115* 119* 118*  CO2 22 22 21* 26  GLUCOSE 178* 147* 110* 226*  BUN 63* 78* 59* 39*  CREATININE 1.70* 1.91* 1.45* 1.24  CALCIUM 10.4* 9.5 10.0 9.4   Liver Function Tests: Recent Labs  Lab 04/19/20 0917 04/22/20 0523  AST 19 18  ALT 19 15  ALKPHOS 102 75  BILITOT 1.6* 1.0  PROT 7.3 6.1*  ALBUMIN 3.5 2.8*   CBC: Recent Labs  Lab 04/19/20 0917 04/20/20 0315 04/21/20 0402 04/22/20 0523  WBC 11.8* 9.8 11.3* 10.7*  NEUTROABS 10.2*  --   --   --   HGB 13.2 11.6* 12.3* 12.1*  HCT 40.8 36.2* 35.7* 35.2*  MCV 105.2* 105.5* 100.8* 98.9  PLT 342 271 279 268   Cardiac Enzymes: Recent Labs  Lab 04/19/20 0917  CKTOTAL 79   CBG: Recent Labs  Lab 04/21/20 1628 04/21/20 2107 04/21/20 2214 04/22/20 0804 04/22/20 1200  GLUCAP 78 63* 185* 206* 217*    Recent Results (from the past 240 hour(s))  Culture, blood (Routine x 2)     Status: None (Preliminary result)   Collection Time: 04/19/20  9:17 AM   Specimen: BLOOD LEFT ARM  Result Value Ref Range Status   Specimen Description BLOOD LEFT ARM  Final   Special Requests   Final    BOTTLES DRAWN AEROBIC AND ANAEROBIC Blood Culture results may not be optimal due to an excessive volume of blood received in culture bottles   Culture   Final    NO GROWTH 3 DAYS Performed at [facility], [address] [place], [place] [address]    Report Status PENDING  Incomplete  Culture, blood (Routine x 2)     Status: Abnormal   Collection Time: 04/19/20  2:17 PM   Specimen: BLOOD  Result Value Ref Range Status   Specimen Description   Final    BLOOD BLOOD RIGHT ARM Performed at [facility], [address] [place], [place] [address]    Special Requests   Final    BOTTLES DRAWN AEROBIC AND ANAEROBIC Blood Culture adequate volume Performed at [facility], [address] [place], [place] [address]     Culture  Setup Time   Final    GRAM POSITIVE COCCI AEROBIC BOTTLE ONLY CRITICAL RESULT CALLED TO, READ BACK BY AND VERIFIED WITH: KAREN HAYES AT 1214 04/20/20 SDR    Culture (A)  Final    STAPHYLOCOCCUS EPIDERMIDIS THE SIGNIFICANCE OF ISOLATING THIS ORGANISM FROM A SINGLE SET OF BLOOD CULTURES WHEN MULTIPLE SETS ARE DRAWN IS UNCERTAIN. PLEASE NOTIFY THE MICROBIOLOGY DEPARTMENT WITHIN ONE WEEK IF SPECIATION AND SENSITIVITIES ARE REQUIRED. Performed at [facility], [place] [address], [place], [place] [address]    Report Status 04/21/2020 FINAL  Final  Blood Culture ID Panel (Reflexed)     Status: Abnormal   Collection Time: 04/19/20  2:17 PM  Result Value Ref Range Status   Enterococcus faecalis NOT DETECTED NOT DETECTED Final   Enterococcus Faecium NOT DETECTED NOT DETECTED Final   Listeria monocytogenes NOT DETECTED NOT DETECTED Final   Staphylococcus species DETECTED (A) NOT DETECTED Final    Comment: CRITICAL RESULT CALLED TO, READ BACK BY AND VERIFIED WITH: KAREN HAYES AT 1214 04/20/20 SDR    Staphylococcus aureus (BCID) NOT DETECTED NOT DETECTED Final   Staphylococcus epidermidis DETECTED (A) NOT DETECTED Final    Comment: CRITICAL RESULT CALLED TO, READ BACK BY AND VERIFIED WITH:  KAREN HAYES AT 1214 04/20/20 SDR    Staphylococcus lugdunensis NOT DETECTED NOT DETECTED Final   Streptococcus species NOT DETECTED NOT DETECTED Final   Streptococcus agalactiae NOT DETECTED NOT DETECTED Final   Streptococcus pneumoniae NOT DETECTED NOT DETECTED Final   Streptococcus pyogenes NOT DETECTED NOT DETECTED Final   A.calcoaceticus-baumannii NOT DETECTED NOT DETECTED Final   Bacteroides fragilis NOT DETECTED NOT DETECTED Final   Enterobacterales NOT DETECTED NOT DETECTED Final   Enterobacter cloacae complex NOT DETECTED NOT DETECTED Final   Escherichia coli NOT DETECTED NOT DETECTED Final   Klebsiella aerogenes NOT DETECTED NOT DETECTED Final   Klebsiella oxytoca NOT DETECTED NOT  DETECTED Final   Klebsiella pneumoniae NOT DETECTED NOT DETECTED Final   Proteus species NOT DETECTED NOT DETECTED Final   Salmonella species NOT DETECTED NOT DETECTED Final   Serratia marcescens NOT DETECTED NOT DETECTED Final   Haemophilus influenzae NOT DETECTED NOT DETECTED Final   Neisseria meningitidis NOT DETECTED NOT DETECTED Final   Pseudomonas aeruginosa NOT DETECTED NOT DETECTED Final   Stenotrophomonas maltophilia NOT DETECTED NOT DETECTED Final   Candida albicans NOT DETECTED NOT DETECTED Final   Candida auris NOT DETECTED  NOT DETECTED Final   Candida glabrata NOT DETECTED NOT DETECTED Final   Candida krusei NOT DETECTED NOT DETECTED Final   Candida parapsilosis NOT DETECTED NOT DETECTED Final   Candida tropicalis NOT DETECTED NOT DETECTED Final   Cryptococcus neoformans/gattii NOT DETECTED NOT DETECTED Final   Methicillin resistance mecA/C NOT DETECTED NOT DETECTED Final    Comment: Performed at [facility], [address] [place], [place] [address]  Respiratory Panel by RT PCR (Flu A&B, Covid) - Nasopharyngeal Swab     Status: None   Collection Time: 04/19/20  2:47 PM   Specimen: Nasopharyngeal Swab  Result Value Ref Range Status   SARS Coronavirus 2 by RT PCR NEGATIVE NEGATIVE Final    Comment: (NOTE) SARS-CoV-2 target nucleic acids are NOT DETECTED.  The SARS-CoV-2 RNA is generally detectable in upper respiratoy specimens during the acute phase of infection. The lowest concentration of SARS-CoV-2 viral copies this assay can detect is 131 copies/mL. A negative result does not preclude SARS-Cov-2 infection and should not be used as the sole basis for treatment or other patient management decisions. A negative result may occur with  improper specimen collection/handling, submission of specimen other than nasopharyngeal swab, presence of viral mutation(s) within the areas targeted by this assay, and inadequate number of viral copies (<131 copies/mL). A  negative result must be combined with clinical observations, patient history, and epidemiological information. The expected result is Negative.  Fact Sheet for Patients:  PinkCheek.be  Fact Sheet for Healthcare Providers:  GravelBags.it  This test is no t yet approved or cleared by the [place] FDA and  has been authorized for detection and/or diagnosis of SARS-CoV-2 by FDA under an Emergency Use Authorization (EUA). This EUA will remain  in effect (meaning this test can be used) for the duration of the COVID-19 declaration under Section 564(b)(1) of the Act, 21 U.S.C. section 360bbb-3(b)(1), unless the authorization is terminated or revoked sooner.     Influenza A by PCR NEGATIVE NEGATIVE Final   Influenza B by PCR NEGATIVE NEGATIVE Final    Comment: (NOTE) The Xpert Xpress SARS-CoV-2/FLU/RSV assay is intended as an aid in  the diagnosis of influenza from Nasopharyngeal swab specimens and  should not be used as a sole basis for treatment. Nasal washings and  aspirates are unacceptable for Xpert Xpress SARS-CoV-2/FLU/RSV  testing.  Fact Sheet for Patients: PinkCheek.be  Fact Sheet for Healthcare Providers: GravelBags.it  This test is not yet approved or cleared by the [place] FDA and  has been authorized for detection and/or diagnosis of SARS-CoV-2 by  FDA under an Emergency Use Authorization (EUA). This EUA will remain  in effect (meaning this test can be used) for the duration of the  Covid-19 declaration under Section 564(b)(1) of the Act, 21  U.S.C. section 360bbb-3(b)(1), unless the authorization is  terminated or revoked. Performed at [facility], [address] [place], [place] [address]   Urine Culture     Status: Abnormal   Collection Time: 04/19/20  2:47 PM   Specimen: Urine, Random  Result Value Ref Range Status   Specimen  Description   Final    URINE, RANDOM Performed at [facility], [address] [place], [place] [address]    Special Requests   Final    NONE Performed at [facility], [address] [place], [place] [address]    Culture >=100,000 COLONIES/mL ESCHERICHIA COLI (A)  Final   Report Status 04/21/2020 FINAL  Final  Organism ID, Bacteria ESCHERICHIA COLI (A)  Final      Susceptibility   Escherichia coli - MIC*    AMPICILLIN >=32 RESISTANT Resistant     CEFAZOLIN <=4 SENSITIVE Sensitive     CEFTRIAXONE <=0.25 SENSITIVE Sensitive     CIPROFLOXACIN >=4 RESISTANT Resistant     GENTAMICIN <=1 SENSITIVE Sensitive     IMIPENEM <=0.25 SENSITIVE Sensitive     NITROFURANTOIN <=16 SENSITIVE Sensitive     TRIMETH/SULFA <=20 SENSITIVE Sensitive     AMPICILLIN/SULBACTAM 16 INTERMEDIATE Intermediate     PIP/TAZO <=4 SENSITIVE Sensitive     * >=100,000 COLONIES/mL ESCHERICHIA COLI  MRSA PCR Screening     Status: None   Collection Time: 04/20/20  4:49 PM   Specimen: Nasopharyngeal  Result Value Ref Range Status   MRSA by PCR NEGATIVE NEGATIVE Final    Comment:        The GeneXpert MRSA Assay (FDA approved for NASAL specimens only), is one component of a comprehensive MRSA colonization surveillance program. It is not intended to diagnose MRSA infection nor to guide or monitor treatment for MRSA infections. Performed at [facility], [address], [place], [place] [address]      Studies: CT HEAD WO CONTRAST  Result Date: 04/21/2020 CLINICAL DATA:  Unresponsive. EXAM: CT HEAD WITHOUT CONTRAST TECHNIQUE: Contiguous axial images were obtained from the base of the skull through the vertex without intravenous contrast. COMPARISON:  11/23/2018 FINDINGS: Brain: There is no evidence of an acute infarct, intracranial hemorrhage, midline shift, or extra-axial fluid collection. Patchy hypodensities in the cerebral white matter bilaterally have mildly progressed and are  nonspecific but compatible with mild-to-moderate chronic small vessel ischemic disease. Moderate cerebral atrophy has also likely slightly progressed. A 1.1 cm calcified extra-axial mass at the vertex to the right of the falx and superior sagittal sinus is unchanged and likely reflects a small meningioma. Vascular: Calcified atherosclerosis at the skull base. No hyperdense vessel. Skull: No fracture or suspicious osseous lesion. Sinuses/Orbits: Visualized paranasal sinuses and mastoid air cells are clear. Bilateral cataract extraction. Other: None. IMPRESSION: 1. No evidence of acute intracranial abnormality. 2. Mild-to-moderate chronic small vessel ischemic disease and cerebral atrophy. 3. Unchanged 1.1 cm right parafalcine meningioma. Electronically Signed   By: Logan Bores M.D.   On: 04/21/2020 09:09   DG Chest Port 1 View  Result Date: 04/22/2020 CLINICAL DATA:  Altered mental status EXAM: PORTABLE CHEST 1 VIEW COMPARISON:  December 25, 2007 FINDINGS: The cardiomediastinal silhouette is mildly enlarged in contour.Atherosclerotic calcifications of the aorta. Mildly tortuous thoracic aorta. No pleural effusion. No pneumothorax. No acute pleuroparenchymal abnormality. Visualized abdomen is unremarkable. Multilevel degenerative changes of the thoracic spine. IMPRESSION: No acute cardiopulmonary abnormality. Electronically Signed   By: Valentino Saxon MD   On: 04/22/2020 08:26    Scheduled Meds: . enoxaparin (LOVENOX) injection  40 mg Subcutaneous Q24H  . neomycin-polymyxin b-dexamethasone  1 application Left Eye QHS  . pantoprazole (PROTONIX) IV  40 mg Intravenous Q12H  . timolol  1 drop Both Eyes BID   Continuous Infusions: .  ceFAZolin (ANCEF) IV 2 g (04/22/20 0500)  . dextrose 5 % and 0.45% NaCl 75 mL/hr at 04/21/20 2151    Assessment/Plan:  1. Acute metabolic encephalopathy.  Patient a little more alert today.  Nursing staff tried to feed some thickened liquids and coughed right away.  Will  make n.p.o. again.  Will likely get hospice evaluation tomorrow.  Will need speech therapy reevaluation tomorrow. 2. E. coli  UTI.  Continue Ancef.  Kidney stone in the bladder. 3. Positive blood culture 1 bottle with staph epidermidis.  Ancef would cover this but likely contamination.  Repeat blood cultures today. 4. Acute kidney injury on chronic kidney disease stage IIIa.  Creatinine improved to 1.24 today 5. Hyponatremia and hypokalemia change fluids to D5 +20 mEq KCl at 75 cc/h.  Potassium still low at 3.2 and sodium still high at 153. 6. Type 2 diabetes mellitus with hyperlipidemia.  Holding sliding scale at this point and pravastatin on hold. 7. Hypercalcemia improved with IV fluids 8. Mood disorder holding psychiatric medications 9. Stage II decubitus ulcer present on admission.  See description below  Pressure Injury 04/20/20 Buttocks Mid Stage 2 -  Partial thickness loss of dermis presenting as a shallow open injury with a red, pink wound bed without slough. (Active)  04/20/20 1630  Location: Buttocks  Location Orientation: Mid  Staging: Stage 2 -  Partial thickness loss of dermis presenting as a shallow open injury with a red, pink wound bed without slough.  Wound Description (Comments):   Present on Admission: Yes       Code Status:     Code Status Orders  (From admission, onward)         Start     Ordered   04/19/20 1515  Do not attempt resuscitation (DNR)  Continuous       Question Answer Comment  In the event of cardiac or respiratory ARREST Do not call a "code blue"   In the event of cardiac or respiratory ARREST Do not perform Intubation, CPR, defibrillation or ACLS   In the event of cardiac or respiratory ARREST Use medication by any route, position, wound care, and other measures to relive pain and suffering. May use oxygen, suction and manual treatment of airway obstruction as needed for comfort.   Comments nurse may pronounce      04/19/20 1515        Code  Status History    This patient has a current code status but no historical code status.   Advance Care Planning Activity     Family Communication: Spoke with son at the bedside and on the phone this afternoon. Disposition Plan: Status is: Inpatient  Dispo: The patient is from: Home              Anticipated d/c is to: To be determined based on clinical course              Anticipated d/c date is: To be determined based on clinical course              Patient currently still with acute metabolic encephalopathy.  Not alert enough in order to swallow properly.  Speech therapy reevaluation.  Will get hospice evaluation for tomorrow morning.  Time spent: 29 minutes  [place]

## 2020-04-22 NOTE — Progress Notes (Signed)
Very effective oral care completed at this time.  A large amount of thick yellow secretions were cleared from oral cavity via oral suction.  Pt tolerated very well.  Son at bedside.  Per son and MD's request,  I attempted a tiny sip of thickened liquid off a plastic spoon.  In my nursing judgement, pt did not tolerate this po intake and immediately coughed.  I explained to son that ST would return to do a swallow eval and to let them decide since this is their area of expertise.  Son verbalized understanding and agreed.  Pt is now resting comfortably in semifowlers position.

## 2020-04-23 DIAGNOSIS — G9341 Metabolic encephalopathy: Secondary | ICD-10-CM | POA: Diagnosis not present

## 2020-04-23 DIAGNOSIS — E876 Hypokalemia: Secondary | ICD-10-CM | POA: Diagnosis not present

## 2020-04-23 DIAGNOSIS — N179 Acute kidney failure, unspecified: Secondary | ICD-10-CM | POA: Diagnosis not present

## 2020-04-23 DIAGNOSIS — N390 Urinary tract infection, site not specified: Secondary | ICD-10-CM | POA: Diagnosis not present

## 2020-04-23 LAB — GLUCOSE, CAPILLARY
Glucose-Capillary: 190 mg/dL — ABNORMAL HIGH (ref 70–99)
Glucose-Capillary: 170 mg/dL — ABNORMAL HIGH (ref 70–99)
Glucose-Capillary: 160 mg/dL — ABNORMAL HIGH (ref 70–99)

## 2020-04-23 MED ORDER — MORPHINE SULFATE (CONCENTRATE) 10 MG/0.5ML PO SOLN: 5.0000 mg | ORAL | Status: DC | PRN

## 2020-04-23 MED ORDER — GLYCOPYRROLATE 0.2 MG/ML IJ SOLN
0.2000 mg | INTRAMUSCULAR | Status: DC | PRN
  Administered 2020-04-24: 0.2 mg via INTRAVENOUS
  Filled 2020-04-23 (×3): qty 1

## 2020-04-23 MED ORDER — BIOTENE DRY MOUTH MT LIQD: 15.0000 mL | OROMUCOSAL | Status: DC | PRN

## 2020-04-23 MED ORDER — HALOPERIDOL 0.5 MG PO TABS
0.5000 mg | ORAL_TABLET | ORAL | Status: DC | PRN
  Filled 2020-04-23: qty 1

## 2020-04-23 MED ORDER — HALOPERIDOL LACTATE 2 MG/ML PO CONC
0.5000 mg | ORAL | Status: DC | PRN
  Filled 2020-04-23: qty 0.3

## 2020-04-23 MED ORDER — HALOPERIDOL LACTATE 5 MG/ML IJ SOLN: 0.5000 mg | INTRAMUSCULAR | Status: DC | PRN

## 2020-04-23 MED ORDER — POLYVINYL ALCOHOL 1.4 % OP SOLN
1.0000 [drp] | Freq: Four times a day (QID) | OPHTHALMIC | Status: DC | PRN
  Filled 2020-04-23: qty 15

## 2020-04-23 MED ORDER — GLYCOPYRROLATE 1 MG PO TABS
1.0000 mg | ORAL_TABLET | ORAL | Status: DC | PRN
  Filled 2020-04-23: qty 1

## 2020-04-23 MED ORDER — GLYCOPYRROLATE 0.2 MG/ML IJ SOLN
0.2000 mg | INTRAMUSCULAR | Status: DC | PRN
  Filled 2020-04-23: qty 1

## 2020-04-23 NOTE — Care Management Important Message (Signed)
Important Message  Patient Details  Name: Jeremy Le. MRN: [id number] Date of Birth: [birth year]   Medicare Important Message Given:  Yes     Dannette Barbara 04/23/2020, 11:12 AM

## 2020-04-23 NOTE — TOC Initial Note (Signed)
Transition of Care (TOC) - Initial/Assessment Note    Patient Details  Name: Jeremy Le. MRN: [id number] Date of Birth: 09-23-1921  Transition of Care [facility]) CM/SW Contact:    Shelbie Ammons, RN Phone Number: 04/23/2020, 3:44 PM  Clinical Narrative:  RNCM met with patient's son Jeremy Le in room at bedside. Discussed with Jeremy Le that MD is recommending Hospice services for patient, Jeremy Le reports that he is in agreement with this plan. Jeremy Le reports that patient had been gradually declining and they ended up moving him to [place] about a month ago. Jeremy Le reports that they would like him to return there if possible and if not they are agreeable to a [place] referral.  RNCM reached out to [place] with [facility] and she reported at this time she feels that patient would need a higher level of care.  RNCM reached out and gave referral to [facility].                  Expected Discharge Plan: [place] Barriers to Discharge: No Barriers Identified   Patient Goals and CMS Choice        Expected Discharge Plan and Services Expected Discharge Plan: [place]   Discharge Planning Services: CM Consult   Living arrangements for the past 2 months: [place]                                      Prior Living Arrangements/Services Living arrangements for the past 2 months: [place] Lives with:: Facility Resident Patient language and need for interpreter reviewed:: Yes Do you feel safe going back to the place where you live?: No      Need for Family Participation in Patient Care: Yes (Comment) Care giver support system in place?: Yes (comment)   Criminal Activity/Legal Involvement Pertinent to Current Situation/Hospitalization: No - Comment as needed  Activities of Daily Living Home Assistive Devices/Equipment: Walker (specify type) ADL Screening (condition at time of admission) Patient's cognitive  ability adequate to safely complete daily activities?: No Is the patient deaf or have difficulty hearing?: Yes Does the patient have difficulty seeing, even when wearing glasses/contacts?: Yes Does the patient have difficulty concentrating, remembering, or making decisions?: Yes Patient able to express need for assistance with ADLs?: No Does the patient have difficulty dressing or bathing?: Yes Independently performs ADLs?: No Does the patient have difficulty walking or climbing stairs?: Yes Weakness of Legs: Both Weakness of Arms/Hands: Both  Permission Sought/Granted                  Emotional Assessment Appearance:: Appears stated age     Orientation: : Oriented to Self, Oriented to Place, Oriented to  Time, Oriented to Situation Alcohol / Substance Use: Not Applicable Psych Involvement: No (comment)  Admission diagnosis:  Acute kidney injury ([place]) [N17.9] E. coli urinary tract infection [N39.0, B96.20] Altered mental status, unspecified altered mental status type [R41.82] Patient Active Problem List   Diagnosis Date Noted  . Hypokalemia   . Pressure injury of skin 04/21/2020  . Acute kidney injury superimposed on CKD ([place]) 04/19/2020  . Acute metabolic encephalopathy   . E. coli urinary tract infection   . Hypercalcemia   . Hypernatremia   . Bipolar disorder in full remission ([place]) 03/31/2017  . Exudative age-related macular degeneration ([place]) 04/21/2016  . History of mood disorder 03/16/2015  .  GERD (gastroesophageal reflux disease) 12/13/2014  . Astigmatism of both eyes 03/07/2012  . Choroidal nevus of left eye 03/07/2012  . POAG (primary open-angle glaucoma) 03/07/2012  . Pseudophakia of both eyes 03/07/2012  . Anemia, unspecified 11/07/2011  . Type 2 diabetes mellitus with hyperlipidemia ([place]) 09/19/2011   PCP:  Marinda Elk, MD Pharmacy:   [place], [place] - [place] [address] Phone:  [phone] Fax: [phone]     Social Determinants of Health (SDOH) Interventions    Readmission Risk Interventions No flowsheet data found.

## 2020-04-23 NOTE — Evaluation (Signed)
Physical Therapy Evaluation Patient Details Name: Jeremy Le. MRN: [id number] DOB: 1922-03-31 Today's Date: 04/23/2020   History of Present Illness  Jeremy Le  is a 86 y.o. male brought in with altered mental status.  The patient's daughter thought he had a UTI starting about 10 days ago but took a while to get the urine analysis and urine culture. Urine culture growing E. coli. PMH includes Angiopathy, Diabetes mellitus type II, non insulin dependent (Jeremy Le), Diabetic peripheral neuropathy (Jeremy Le), Hammertoe, Onychomycosis, Osteoarthritis, and Prostate cancer. MD assessment includes Acute kidney injury on chronic kidney disease stage 3b, Acute metabolic encephalopathy likely secondary to UTI and dehydration, E. coli UTI, Hypercalcemia, and  Hypernatremia secondary to dehydration  Clinical Impression  Pt was very lethargic throughout the session. Pt was oriented to self and was aware that he is in [facility] but did not know he was in the hospital, why he was there, or what the date was. Pt occasionally responded to verbal and tactile commands but was largely non-responsive. Pt was Total A +2 for rolling and Supine to sit and sit to supine. Pt able to maintain sitting balance EOB but could not tolerate any challenge in this position. Pt displays a very kyphotic posture that is not self correctable and is present in lying in supine. Pt will benefit from PT services in a SNF setting upon discharge to safely address deficits listed in patient problem list for decreased caregiver assistance and eventual return to PLOF.     Follow Up Recommendations SNF    Equipment Recommendations  Other (comment) (to be determined at next level of care)    Recommendations for Other Services       Precautions / Restrictions Restrictions Weight Bearing Restrictions: No      Mobility  Bed Mobility Overal bed mobility: Needs Assistance Bed Mobility: Rolling;Sit to Supine;Supine to Sit Rolling: Total assist;+2  for physical assistance   Supine to sit: Total assist;+2 for physical assistance Sit to supine: Total assist;+2 for physical assistance   General bed mobility comments: pt dis not initiate or contribute to bed mobility. Unsure if physically unable or if cognition is imparing movement  Transfers                 General transfer comment: Not attempted due to safety  Ambulation/Gait             General Gait Details: Not attempted due to safety  Stairs            Wheelchair Mobility    Modified Rankin (Stroke Patients Only)       Balance Overall balance assessment: Needs assistance Sitting-balance support: Bilateral upper extremity supported;Feet unsupported Sitting balance-Leahy Scale: Fair Sitting balance - Comments: pt able to maintain sitting balance with bilateral UE support but could not balance with minor challenges to balance       Standing balance comment: not attempted due to safety                             Pertinent Vitals/Pain Pain Assessment: Faces Faces Pain Scale: Hurts a little bit Pain Descriptors / Indicators: Moaning Pain Intervention(s): Limited activity within patient's tolerance;Monitored during session;Repositioned    Home Living Family/patient expects to be discharged to:: Assisted living               Home Equipment: Wheelchair - manual      Prior Function Level of Independence: Needs assistance  Gait / Transfers Assistance Needed: Pt has not walked during this past month and [facility] staff use WC for all transportation. Transfers are Max +2  ADL's / Homemaking Assistance Needed: pt assisted with all ADLs        Hand Dominance        Extremity/Trunk Assessment                Communication   Communication: HOH  Cognition Arousal/Alertness: Lethargic Behavior During Therapy: Flat affect Overall Cognitive Status: Impaired/Different from baseline Area of Impairment:  Orientation;Attention;Following commands;Awareness;Safety/judgement;Memory                 Orientation Level: Disoriented to;Place;Time;Situation             General Comments: pt able to state name and knows that he is in [place]. Pt is largely non-responsive to commands and questions and only occasionally responds. Pt does not make eye contact      General Comments General comments (skin integrity, edema, etc.): pt displays kyphotic posture in sitting and mantains FHRS even while lying in supine. Some skin breakdown noted over thoracic spinous processes    Exercises Other Exercises Other Exercises: pt sat EOB for 3 minutes for tolerence Other Exercises: pt's family educated on POC   Assessment/Plan    PT Assessment Patient needs continued PT services  PT Problem List Decreased strength;Decreased mobility;Decreased safety awareness;Decreased range of motion;Decreased activity tolerance;Decreased cognition;Decreased balance;Impaired sensation       PT Treatment Interventions Therapeutic exercise;Gait training;Balance training;Functional mobility training;Cognitive remediation;Therapeutic activities;Patient/family education    PT Goals (Current goals can be found in the Care Plan section)  Acute Rehab PT Goals PT Goal Formulation: Patient unable to participate in goal setting Time For Goal Achievement: 05/06/20 Potential to Achieve Goals: Fair    Frequency Min 2X/week   Barriers to discharge        Co-evaluation               AM-PAC PT "6 Clicks" Mobility  Outcome Measure Help needed turning from your back to your side while in a flat bed without using bedrails?: Total Help needed moving from lying on your back to sitting on the side of a flat bed without using bedrails?: Total Help needed moving to and from a bed to a chair (including a wheelchair)?: Total Help needed standing up from a chair using your arms (e.g., wheelchair or bedside chair)?:  Total Help needed to walk in hospital room?: Total Help needed climbing 3-5 steps with a railing? : Total 6 Click Score: 6    End of Session   Activity Tolerance: Patient limited by lethargy Patient left: in bed;with bed alarm set;with family/visitor present;with call bell/phone within reach Nurse Communication: Mobility status;Other (comment) (nurse notified about thoracic skin breakdown and notified that external cathetar came out) PT Visit Diagnosis: Muscle weakness (generalized) (M62.81)    Time: 6979-4801 PT Time Calculation (min) (ACUTE ONLY): 26 min   Charges:             Hervey Ard, SPT 04/23/20. 1:23 PM

## 2020-04-23 NOTE — Progress Notes (Addendum)
Palliative:  Chart reviewed, checked in with RN, and assessed patient. Hospice liaison notified me that goals are clear and patient is going to hospice home. Will not proceed with further goals of care discussion. No needs identified per hospice liaison.  Palliative medicine team is available for any needs.  Juel Burrow, DNP, AGNP-C Palliative Medicine Team Team Phone # [phone]  Pager # [phone]  NO CHARGE

## 2020-04-23 NOTE — Progress Notes (Signed)
Patient ID: Jeremy Le., male   DOB: 14-Mar-1922, 86 y.o.   MRN: [id number] [facility] PROGRESS NOTE  Jeremy Le. NKN:[id number] DOB: 06/23/1922 DOA: 04/19/2020 PCP: Marinda Elk, MD  HPI/Subjective: Patient seen this morning and again when son came in.  Patient stated he was not hungry.  No abdominal pain.  No shortness of breath or chest pain.  Objective: Vitals:   04/23/20 0745 04/23/20 1128  BP: (!) 118/51 (!) 125/54  Pulse: 75 63  Resp: (!) 21 20  Temp: 98.4 F (36.9 C) 97.7 F (36.5 C)  SpO2: 97% 99%    Intake/Output Summary (Last 24 hours) at 04/23/2020 1535 Last data filed at 04/23/2020 0235 Gross per 24 hour  Intake --  Output 800 ml  Net -800 ml   Filed Weights   04/19/20 0848  Weight: 77.1 kg    ROS: Review of Systems  Respiratory: Negative for shortness of breath.   Cardiovascular: Negative for chest pain.  Gastrointestinal: Negative for abdominal pain.   Exam: Physical Exam HENT:     Head: Normocephalic.     Mouth/Throat:     Pharynx: No oropharyngeal exudate.  Eyes:     General: Lids are normal.     Conjunctiva/sclera: Conjunctivae normal.  Cardiovascular:     Rate and Rhythm: Normal rate and regular rhythm.     Heart sounds: Normal heart sounds, S1 normal and S2 normal.  Pulmonary:     Breath sounds: Examination of the right-lower field reveals decreased breath sounds. Examination of the left-lower field reveals decreased breath sounds. Decreased breath sounds present. No wheezing, rhonchi or rales.  Abdominal:     Palpations: Abdomen is soft.     Tenderness: There is no abdominal tenderness.  Musculoskeletal:     Right lower leg: No swelling.     Left lower leg: No swelling.  Skin:    General: Skin is warm.     Findings: No rash.  Neurological:     Mental Status: He is alert.       Data Reviewed: Basic Metabolic Panel: Recent Labs  Lab 04/19/20 0917 04/20/20 0315 04/21/20 0402 04/22/20 0523  NA 148* 153* 153*  153*  K 3.6 3.1* 3.4* 3.2*  CL 110 115* 119* 118*  CO2 22 22 21* 26  GLUCOSE 178* 147* 110* 226*  BUN 63* 78* 59* 39*  CREATININE 1.70* 1.91* 1.45* 1.24  CALCIUM 10.4* 9.5 10.0 9.4   Liver Function Tests: Recent Labs  Lab 04/19/20 0917 04/22/20 0523  AST 19 18  ALT 19 15  ALKPHOS 102 75  BILITOT 1.6* 1.0  PROT 7.3 6.1*  ALBUMIN 3.5 2.8*   CBC: Recent Labs  Lab 04/19/20 0917 04/20/20 0315 04/21/20 0402 04/22/20 0523  WBC 11.8* 9.8 11.3* 10.7*  NEUTROABS 10.2*  --   --   --   HGB 13.2 11.6* 12.3* 12.1*  HCT 40.8 36.2* 35.7* 35.2*  MCV 105.2* 105.5* 100.8* 98.9  PLT 342 271 279 268   Cardiac Enzymes: Recent Labs  Lab 04/19/20 0917  CKTOTAL 79   CBG: Recent Labs  Lab 04/22/20 1200 04/22/20 1632 04/22/20 2054 04/23/20 0747 04/23/20 1129  GLUCAP 217* 213* 197* 190* 170*    Recent Results (from the past 240 hour(s))  Culture, blood (Routine x 2)     Status: None (Preliminary result)   Collection Time: 04/19/20  9:17 AM   Specimen: BLOOD LEFT ARM  Result Value Ref Range Status   Specimen Description BLOOD LEFT ARM  Final   Special Requests   Final    BOTTLES DRAWN AEROBIC AND ANAEROBIC Blood Culture results may not be optimal due to an excessive volume of blood received in culture bottles   Culture   Final    NO GROWTH 4 DAYS Performed at [facility], [address], [place], [place] [address]    Report Status PENDING  Incomplete  Culture, blood (Routine x 2)     Status: Abnormal   Collection Time: 04/19/20  2:17 PM   Specimen: BLOOD  Result Value Ref Range Status   Specimen Description   Final    BLOOD BLOOD RIGHT ARM Performed at [facility], [address] [place], [place] [address]    Special Requests   Final    BOTTLES DRAWN AEROBIC AND ANAEROBIC Blood Culture adequate volume Performed at [facility], [address] [place], [place] [address]    Culture  Setup Time   Final    GRAM POSITIVE COCCI AEROBIC  BOTTLE ONLY CRITICAL RESULT CALLED TO, READ BACK BY AND VERIFIED WITH: KAREN HAYES AT 1214 04/20/20 SDR    Culture (A)  Final    STAPHYLOCOCCUS EPIDERMIDIS THE SIGNIFICANCE OF ISOLATING THIS ORGANISM FROM A SINGLE SET OF BLOOD CULTURES WHEN MULTIPLE SETS ARE DRAWN IS UNCERTAIN. PLEASE NOTIFY THE MICROBIOLOGY DEPARTMENT WITHIN ONE WEEK IF SPECIATION AND SENSITIVITIES ARE REQUIRED. Performed at [facility], [place] [address], [place], [place] [address]    Report Status 04/21/2020 FINAL  Final  Blood Culture ID Panel (Reflexed)     Status: Abnormal   Collection Time: 04/19/20  2:17 PM  Result Value Ref Range Status   Enterococcus faecalis NOT DETECTED NOT DETECTED Final   Enterococcus Faecium NOT DETECTED NOT DETECTED Final   Listeria monocytogenes NOT DETECTED NOT DETECTED Final   Staphylococcus species DETECTED (A) NOT DETECTED Final    Comment: CRITICAL RESULT CALLED TO, READ BACK BY AND VERIFIED WITH: KAREN HAYES AT 1214 04/20/20 SDR    Staphylococcus aureus (BCID) NOT DETECTED NOT DETECTED Final   Staphylococcus epidermidis DETECTED (A) NOT DETECTED Final    Comment: CRITICAL RESULT CALLED TO, READ BACK BY AND VERIFIED WITH:  KAREN HAYES AT 1214 04/20/20 SDR    Staphylococcus lugdunensis NOT DETECTED NOT DETECTED Final   Streptococcus species NOT DETECTED NOT DETECTED Final   Streptococcus agalactiae NOT DETECTED NOT DETECTED Final   Streptococcus pneumoniae NOT DETECTED NOT DETECTED Final   Streptococcus pyogenes NOT DETECTED NOT DETECTED Final   A.calcoaceticus-baumannii NOT DETECTED NOT DETECTED Final   Bacteroides fragilis NOT DETECTED NOT DETECTED Final   Enterobacterales NOT DETECTED NOT DETECTED Final   Enterobacter cloacae complex NOT DETECTED NOT DETECTED Final   Escherichia coli NOT DETECTED NOT DETECTED Final   Klebsiella aerogenes NOT DETECTED NOT DETECTED Final   Klebsiella oxytoca NOT DETECTED NOT DETECTED Final   Klebsiella pneumoniae NOT DETECTED NOT DETECTED  Final   Proteus species NOT DETECTED NOT DETECTED Final   Salmonella species NOT DETECTED NOT DETECTED Final   Serratia marcescens NOT DETECTED NOT DETECTED Final   Haemophilus influenzae NOT DETECTED NOT DETECTED Final   Neisseria meningitidis NOT DETECTED NOT DETECTED Final   Pseudomonas aeruginosa NOT DETECTED NOT DETECTED Final   Stenotrophomonas maltophilia NOT DETECTED NOT DETECTED Final   Candida albicans NOT DETECTED NOT DETECTED Final   Candida auris NOT DETECTED NOT DETECTED Final   Candida glabrata NOT DETECTED NOT DETECTED Final   Candida krusei NOT DETECTED NOT DETECTED Final  Candida parapsilosis NOT DETECTED NOT DETECTED Final   Candida tropicalis NOT DETECTED NOT DETECTED Final   Cryptococcus neoformans/gattii NOT DETECTED NOT DETECTED Final   Methicillin resistance mecA/C NOT DETECTED NOT DETECTED Final    Comment: Performed at [facility], [address] [place], [place] [address]  Respiratory Panel by RT PCR (Flu A&B, Covid) - Nasopharyngeal Swab     Status: None   Collection Time: 04/19/20  2:47 PM   Specimen: Nasopharyngeal Swab  Result Value Ref Range Status   SARS Coronavirus 2 by RT PCR NEGATIVE NEGATIVE Final    Comment: (NOTE) SARS-CoV-2 target nucleic acids are NOT DETECTED.  The SARS-CoV-2 RNA is generally detectable in upper respiratoy specimens during the acute phase of infection. The lowest concentration of SARS-CoV-2 viral copies this assay can detect is 131 copies/mL. A negative result does not preclude SARS-Cov-2 infection and should not be used as the sole basis for treatment or other patient management decisions. A negative result may occur with  improper specimen collection/handling, submission of specimen other than nasopharyngeal swab, presence of viral mutation(s) within the areas targeted by this assay, and inadequate number of viral copies (<131 copies/mL). A negative result must be combined with clinical observations, patient  history, and epidemiological information. The expected result is Negative.  Fact Sheet for Patients:  PinkCheek.be  Fact Sheet for Healthcare Providers:  GravelBags.it  This test is no t yet approved or cleared by the [place] FDA and  has been authorized for detection and/or diagnosis of SARS-CoV-2 by FDA under an Emergency Use Authorization (EUA). This EUA will remain  in effect (meaning this test can be used) for the duration of the COVID-19 declaration under Section 564(b)(1) of the Act, 21 U.S.C. section 360bbb-3(b)(1), unless the authorization is terminated or revoked sooner.     Influenza A by PCR NEGATIVE NEGATIVE Final   Influenza B by PCR NEGATIVE NEGATIVE Final    Comment: (NOTE) The Xpert Xpress SARS-CoV-2/FLU/RSV assay is intended as an aid in  the diagnosis of influenza from Nasopharyngeal swab specimens and  should not be used as a sole basis for treatment. Nasal washings and  aspirates are unacceptable for Xpert Xpress SARS-CoV-2/FLU/RSV  testing.  Fact Sheet for Patients: PinkCheek.be  Fact Sheet for Healthcare Providers: GravelBags.it  This test is not yet approved or cleared by the [place] FDA and  has been authorized for detection and/or diagnosis of SARS-CoV-2 by  FDA under an Emergency Use Authorization (EUA). This EUA will remain  in effect (meaning this test can be used) for the duration of the  Covid-19 declaration under Section 564(b)(1) of the Act, 21  U.S.C. section 360bbb-3(b)(1), unless the authorization is  terminated or revoked. Performed at [facility], [address] [place], [place] [address]   Urine Culture     Status: Abnormal   Collection Time: 04/19/20  2:47 PM   Specimen: Urine, Random  Result Value Ref Range Status   Specimen Description   Final    URINE, RANDOM Performed at [facility], [address] [place], [place] [address]    Special Requests   Final    NONE Performed at [facility], [address] [place],  [address]    Culture >=100,000 COLONIES/mL ESCHERICHIA COLI (A)  Final   Report Status 04/21/2020 FINAL  Final   Organism ID, Bacteria ESCHERICHIA COLI (A)  Final      Susceptibility   Escherichia coli - MIC*  AMPICILLIN >=32 RESISTANT Resistant     CEFAZOLIN <=4 SENSITIVE Sensitive     CEFTRIAXONE <=0.25 SENSITIVE Sensitive     CIPROFLOXACIN >=4 RESISTANT Resistant     GENTAMICIN <=1 SENSITIVE Sensitive     IMIPENEM <=0.25 SENSITIVE Sensitive     NITROFURANTOIN <=16 SENSITIVE Sensitive     TRIMETH/SULFA <=20 SENSITIVE Sensitive     AMPICILLIN/SULBACTAM 16 INTERMEDIATE Intermediate     PIP/TAZO <=4 SENSITIVE Sensitive     * >=100,000 COLONIES/mL ESCHERICHIA COLI  MRSA PCR Screening     Status: None   Collection Time: 04/20/20  4:49 PM   Specimen: Nasopharyngeal  Result Value Ref Range Status   MRSA by PCR NEGATIVE NEGATIVE Final    Comment:        The GeneXpert MRSA Assay (FDA approved for NASAL specimens only), is one component of a comprehensive MRSA colonization surveillance program. It is not intended to diagnose MRSA infection nor to guide or monitor treatment for MRSA infections. Performed at [facility], [address] [place], [place] [address]   CULTURE, BLOOD (ROUTINE X 2) w Reflex to ID Panel     Status: None (Preliminary result)   Collection Time: 04/22/20  5:23 AM   Specimen: BLOOD  Result Value Ref Range Status   Specimen Description BLOOD BLOOD LEFT FOREARM  Final   Special Requests   Final    BOTTLES DRAWN AEROBIC AND ANAEROBIC Blood Culture adequate volume   Culture   Final    NO GROWTH 1 DAY Performed at [facility], [address] [place], [place] [address]    Report Status PENDING  Incomplete  CULTURE, BLOOD (ROUTINE X 2) w Reflex to ID Panel     Status: None  (Preliminary result)   Collection Time: 04/22/20  5:33 AM   Specimen: BLOOD  Result Value Ref Range Status   Specimen Description BLOOD BLOOD LEFT WRIST  Final   Special Requests   Final    BOTTLES DRAWN AEROBIC AND ANAEROBIC Blood Culture results may not be optimal due to an inadequate volume of blood received in culture bottles   Culture   Final    NO GROWTH 1 DAY Performed at [facility], [address] [place], [place] [address]    Report Status PENDING  Incomplete     Studies: DG Chest Port 1 View  Result Date: 04/22/2020 CLINICAL DATA:  Altered mental status EXAM: PORTABLE CHEST 1 VIEW COMPARISON:  December 25, 2007 FINDINGS: The cardiomediastinal silhouette is mildly enlarged in contour.Atherosclerotic calcifications of the aorta. Mildly tortuous thoracic aorta. No pleural effusion. No pneumothorax. No acute pleuroparenchymal abnormality. Visualized abdomen is unremarkable. Multilevel degenerative changes of the thoracic spine. IMPRESSION: No acute cardiopulmonary abnormality. Electronically Signed   By: Valentino Saxon MD   On: 04/22/2020 08:26    Scheduled Meds: . enoxaparin (LOVENOX) injection  40 mg Subcutaneous Q24H  . neomycin-polymyxin b-dexamethasone  1 application Left Eye QHS  . pantoprazole (PROTONIX) IV  40 mg Intravenous Q12H  . timolol  1 drop Both Eyes BID   Continuous Infusions: .  ceFAZolin (ANCEF) IV 2 g (04/23/20 1405)  . dextrose 5 % with KCl 20 mEq / L 20 mEq (04/23/20 0332)    Assessment/Plan:  1. Acute metabolic encephalopathy.  Patient more alert today but still unable to swallow.  Patient still not hungry.  Hospice evaluation today. 2. E. coli UTI and positive blood culture with 1 bottle staph epidermidis.  Continue Ancef while here.  Repeat  BC negative so far. 3. Acute kidney injury on ckd stage IIIa.  Holding on further labs at this point. 4. Hypernatremia and hypokalemia. Continue D5W with potassium 5. Type 2 diabetes with  hyperlipdemia. Holding medications 6. Hypercalcemia. Better with hydration 7. Stage 2 decubiti. See below  Pressure Injury 04/20/20 Buttocks Mid Stage 2 -  Partial thickness loss of dermis presenting as a shallow open injury with a red, pink wound bed without slough. (Active)  04/20/20 1630  Location: Buttocks  Location Orientation: Mid  Staging: Stage 2 -  Partial thickness loss of dermis presenting as a shallow open injury with a red, pink wound bed without slough.  Wound Description (Comments):   Present on Admission: Yes       Code Status:     Code Status Orders  (From admission, onward)         Start     Ordered   04/19/20 1515  Do not attempt resuscitation (DNR)  Continuous       Question Answer Comment  In the event of cardiac or respiratory ARREST Do not call a "code blue"   In the event of cardiac or respiratory ARREST Do not perform Intubation, CPR, defibrillation or ACLS   In the event of cardiac or respiratory ARREST Use medication by any route, position, wound care, and other measures to relive pain and suffering. May use oxygen, suction and manual treatment of airway obstruction as needed for comfort.   Comments nurse may pronounce      04/19/20 1515        Code Status History    This patient has a current code status but no historical code status.   Advance Care Planning Activity     Family Communication: Spoke with son at the bedside Disposition Plan: Status is: Inpatient  Dispo: The patient is from: Home              Anticipated d/c is to: Hospice              Anticipated d/c date is: when we can set up hospice              Patient currently unable to eat  Time spent: 27 minutes  [place]

## 2020-04-23 NOTE — Progress Notes (Addendum)
[facility]) Hospital Liaison RN note:  Received request from Dr. Earleen Newport for family interest in [place]. Chart reviewed and eligibility was approved . Spoke with son, [name] and daughter, Velta Addison in the room to confirm interests and explain services.They verbalized understanding and all questions was answered.  Unfortunately, Hospice Home does not have a bed available today. Family and hospital care team are aware. [place] Liaison will follow for room availability.   Please call with any hospice related questions or concerns.  Thank you for the opportunity to participate in this patient's care.  Zandra Abts, RN [facility] Liaison [phone]

## 2020-04-24 DIAGNOSIS — N179 Acute kidney failure, unspecified: Secondary | ICD-10-CM | POA: Diagnosis not present

## 2020-04-24 DIAGNOSIS — Z515 Encounter for palliative care: Secondary | ICD-10-CM | POA: Diagnosis not present

## 2020-04-24 DIAGNOSIS — G9341 Metabolic encephalopathy: Secondary | ICD-10-CM | POA: Diagnosis not present

## 2020-04-24 DIAGNOSIS — N390 Urinary tract infection, site not specified: Secondary | ICD-10-CM | POA: Diagnosis not present

## 2020-04-24 LAB — CULTURE, BLOOD (ROUTINE X 2): Culture: NO GROWTH

## 2020-04-24 LAB — GLUCOSE, CAPILLARY: Glucose-Capillary: 143 mg/dL — ABNORMAL HIGH (ref 70–99)

## 2020-04-24 MED ORDER — SCOPOLAMINE 1 MG/3DAYS TD PT72
1.0000 | MEDICATED_PATCH | TRANSDERMAL | Status: DC
  Administered 2020-04-24 – 2020-04-27 (×2): 1.5 mg via TRANSDERMAL
  Filled 2020-04-24 (×3): qty 1

## 2020-04-24 NOTE — Progress Notes (Signed)
PT Cancellation Note  Patient Details Name: Jeremy Le. MRN: [id number] DOB: 17-Dec-1921   Cancelled Treatment:    Reason Eval/Treat Not Completed: Other (comment): Per nursing patient is now comfort care.  Will complete PT orders at this time but will reassess pt pending a change in status upon receipt of new PT orders.    Linus Salmons PT, DPT 04/24/20, 11:53 AM

## 2020-04-24 NOTE — Progress Notes (Signed)
Patient ID: Jeremy Le., male   DOB: [birth year], 86 y.o.   MRN: [id number]  Spoke with the patient's son on the phone a little bit earlier. Spoke with the patient's daughter on the phone just now.  Still awaiting hospice home bed. Comfort care measures without hydration and antibiotics at this point.  As needed pain medications.  Patient still having some upper airway secretions will add scopolamine patch.  Dr Loletha Grayer

## 2020-04-24 NOTE — Progress Notes (Signed)
[facility] Room [facility] Liaison RN note:  Visited with patient and son, [name] in room. Patient is resting comfortably and no changes from yesterday. Informed son that no beds were available today at the [place]. Hospital care team is aware. No questions or concerns from son. [place] Liaison will continue to follow daily for room availability.  Please call with any hospice related questions or concerns.  Thank you.  Zandra Abts, RN [facility] Liaison [phone]

## 2020-04-24 NOTE — Progress Notes (Signed)
Patient ID: Jeremy Cornfield., male   DOB: 02/25/1922, 86 y.o.   MRN: [id number] [facility] PROGRESS NOTE  Jeremy Cornfield. JSH:[id number] DOB: January 05, 1922 DOA: 04/19/2020 PCP: Marinda Elk, MD  HPI/Subjective: Patient opened eyes and squeeze my hand.  Did not say much.  Went back to sleep.  Had some upper airway congestion.  Objective: Vitals:   04/23/20 2318 04/24/20 0755  BP: (!) 110/54 129/68  Pulse: 74 88  Resp: 14 (!) 22  Temp: 98.6 F (37 C) 99.7 F (37.6 C)  SpO2: 93% 94%    Intake/Output Summary (Last 24 hours) at 04/24/2020 1457 Last data filed at 04/24/2020 1214 Gross per 24 hour  Intake 1198.73 ml  Output 550 ml  Net 648.73 ml   Filed Weights   04/19/20 0848  Weight: 77.1 kg    ROS: Review of Systems  Unable to perform ROS: Acuity of condition   Exam: Physical Exam HENT:     Mouth/Throat:     Mouth: Mucous membranes are dry.     Pharynx: No uvula swelling.  Eyes:     General: Lids are normal.     Conjunctiva/sclera: Conjunctivae normal.  Cardiovascular:     Rate and Rhythm: Normal rate and regular rhythm.     Heart sounds: Normal heart sounds, S1 normal and S2 normal.  Pulmonary:     Breath sounds: Examination of the right-lower field reveals decreased breath sounds and rhonchi. Examination of the left-lower field reveals decreased breath sounds and rhonchi. Decreased breath sounds and rhonchi present. No wheezing or rales.     Comments: Upper airway congestion. Abdominal:     Palpations: Abdomen is soft.     Tenderness: There is no abdominal tenderness.  Musculoskeletal:     Right ankle: Swelling present.     Left ankle: Swelling present.  Skin:    General: Skin is warm.     Findings: No rash.  Neurological:     Mental Status: He is lethargic.       Data Reviewed: Basic Metabolic Panel: Recent Labs  Lab 04/19/20 0917 04/20/20 0315 04/21/20 0402 04/22/20 0523  NA 148* 153* 153* 153*  K 3.6 3.1* 3.4* 3.2*  CL 110 115* 119*  118*  CO2 22 22 21* 26  GLUCOSE 178* 147* 110* 226*  BUN 63* 78* 59* 39*  CREATININE 1.70* 1.91* 1.45* 1.24  CALCIUM 10.4* 9.5 10.0 9.4   Liver Function Tests: Recent Labs  Lab 04/19/20 0917 04/22/20 0523  AST 19 18  ALT 19 15  ALKPHOS 102 75  BILITOT 1.6* 1.0  PROT 7.3 6.1*  ALBUMIN 3.5 2.8*   CBC: Recent Labs  Lab 04/19/20 0917 04/20/20 0315 04/21/20 0402 04/22/20 0523  WBC 11.8* 9.8 11.3* 10.7*  NEUTROABS 10.2*  --   --   --   HGB 13.2 11.6* 12.3* 12.1*  HCT 40.8 36.2* 35.7* 35.2*  MCV 105.2* 105.5* 100.8* 98.9  PLT 342 271 279 268   Cardiac Enzymes: Recent Labs  Lab 04/19/20 0917  CKTOTAL 79    CBG: Recent Labs  Lab 04/22/20 2054 04/23/20 0747 04/23/20 1129 04/23/20 1627 04/24/20 0751  GLUCAP 197* 190* 170* 160* 143*    Recent Results (from the past 240 hour(s))  Culture, blood (Routine x 2)     Status: None   Collection Time: 04/19/20  9:17 AM   Specimen: BLOOD LEFT ARM  Result Value Ref Range Status   Specimen Description BLOOD LEFT ARM  Final   Special Requests  Final    BOTTLES DRAWN AEROBIC AND ANAEROBIC Blood Culture results may not be optimal due to an excessive volume of blood received in culture bottles   Culture   Final    NO GROWTH 5 DAYS Performed at [facility], [address] [place], [place] [address]    Report Status 04/24/2020 FINAL  Final  Culture, blood (Routine x 2)     Status: Abnormal   Collection Time: 04/19/20  2:17 PM   Specimen: BLOOD  Result Value Ref Range Status   Specimen Description   Final    BLOOD BLOOD RIGHT ARM Performed at [facility], [address] [place], [place] [address]    Special Requests   Final    BOTTLES DRAWN AEROBIC AND ANAEROBIC Blood Culture adequate volume Performed at [facility], [address] [place], [place] [address]    Culture  Setup Time   Final    GRAM POSITIVE COCCI AEROBIC BOTTLE ONLY CRITICAL RESULT CALLED TO, READ BACK BY AND VERIFIED  WITH: KAREN HAYES AT 1214 04/20/20 SDR    Culture (A)  Final    STAPHYLOCOCCUS EPIDERMIDIS THE SIGNIFICANCE OF ISOLATING THIS ORGANISM FROM A SINGLE SET OF BLOOD CULTURES WHEN MULTIPLE SETS ARE DRAWN IS UNCERTAIN. PLEASE NOTIFY THE MICROBIOLOGY DEPARTMENT WITHIN ONE WEEK IF SPECIATION AND SENSITIVITIES ARE REQUIRED. Performed at [facility], [place] [address] [place], [place] [address]    Report Status 04/21/2020 FINAL  Final  Blood Culture ID Panel (Reflexed)     Status: Abnormal   Collection Time: 04/19/20  2:17 PM  Result Value Ref Range Status   Enterococcus faecalis NOT DETECTED NOT DETECTED Final   Enterococcus Faecium NOT DETECTED NOT DETECTED Final   Listeria monocytogenes NOT DETECTED NOT DETECTED Final   Staphylococcus species DETECTED (A) NOT DETECTED Final    Comment: CRITICAL RESULT CALLED TO, READ BACK BY AND VERIFIED WITH: KAREN HAYES AT 1214 04/20/20 SDR    Staphylococcus aureus (BCID) NOT DETECTED NOT DETECTED Final   Staphylococcus epidermidis DETECTED (A) NOT DETECTED Final    Comment: CRITICAL RESULT CALLED TO, READ BACK BY AND VERIFIED WITH:  KAREN HAYES AT 1214 04/20/20 SDR    Staphylococcus lugdunensis NOT DETECTED NOT DETECTED Final   Streptococcus species NOT DETECTED NOT DETECTED Final   Streptococcus agalactiae NOT DETECTED NOT DETECTED Final   Streptococcus pneumoniae NOT DETECTED NOT DETECTED Final   Streptococcus pyogenes NOT DETECTED NOT DETECTED Final   A.calcoaceticus-baumannii NOT DETECTED NOT DETECTED Final   Bacteroides fragilis NOT DETECTED NOT DETECTED Final   Enterobacterales NOT DETECTED NOT DETECTED Final   Enterobacter cloacae complex NOT DETECTED NOT DETECTED Final   Escherichia coli NOT DETECTED NOT DETECTED Final   Klebsiella aerogenes NOT DETECTED NOT DETECTED Final   Klebsiella oxytoca NOT DETECTED NOT DETECTED Final   Klebsiella pneumoniae NOT DETECTED NOT DETECTED Final   Proteus species NOT DETECTED NOT DETECTED Final    Salmonella species NOT DETECTED NOT DETECTED Final   Serratia marcescens NOT DETECTED NOT DETECTED Final   Haemophilus influenzae NOT DETECTED NOT DETECTED Final   Neisseria meningitidis NOT DETECTED NOT DETECTED Final   Pseudomonas aeruginosa NOT DETECTED NOT DETECTED Final   Stenotrophomonas maltophilia NOT DETECTED NOT DETECTED Final   Candida albicans NOT DETECTED NOT DETECTED Final   Candida auris NOT DETECTED NOT DETECTED Final   Candida glabrata NOT DETECTED NOT DETECTED Final   Candida krusei NOT DETECTED NOT DETECTED Final   Candida parapsilosis NOT DETECTED NOT  DETECTED Final   Candida tropicalis NOT DETECTED NOT DETECTED Final   Cryptococcus neoformans/gattii NOT DETECTED NOT DETECTED Final   Methicillin resistance mecA/C NOT DETECTED NOT DETECTED Final    Comment: Performed at [facility], [address] [place], [place] [address]  Respiratory Panel by RT PCR (Flu A&B, Covid) - Nasopharyngeal Swab     Status: None   Collection Time: 04/19/20  2:47 PM   Specimen: Nasopharyngeal Swab  Result Value Ref Range Status   SARS Coronavirus 2 by RT PCR NEGATIVE NEGATIVE Final    Comment: (NOTE) SARS-CoV-2 target nucleic acids are NOT DETECTED.  The SARS-CoV-2 RNA is generally detectable in upper respiratoy specimens during the acute phase of infection. The lowest concentration of SARS-CoV-2 viral copies this assay can detect is 131 copies/mL. A negative result does not preclude SARS-Cov-2 infection and should not be used as the sole basis for treatment or other patient management decisions. A negative result may occur with  improper specimen collection/handling, submission of specimen other than nasopharyngeal swab, presence of viral mutation(s) within the areas targeted by this assay, and inadequate number of viral copies (<131 copies/mL). A negative result must be combined with clinical observations, patient history, and epidemiological information. The expected  result is Negative.  Fact Sheet for Patients:  PinkCheek.be  Fact Sheet for Healthcare Providers:  GravelBags.it  This test is no t yet approved or cleared by the [place] FDA and  has been authorized for detection and/or diagnosis of SARS-CoV-2 by FDA under an Emergency Use Authorization (EUA). This EUA will remain  in effect (meaning this test can be used) for the duration of the COVID-19 declaration under Section 564(b)(1) of the Act, 21 U.S.C. section 360bbb-3(b)(1), unless the authorization is terminated or revoked sooner.     Influenza A by PCR NEGATIVE NEGATIVE Final   Influenza B by PCR NEGATIVE NEGATIVE Final    Comment: (NOTE) The Xpert Xpress SARS-CoV-2/FLU/RSV assay is intended as an aid in  the diagnosis of influenza from Nasopharyngeal swab specimens and  should not be used as a sole basis for treatment. Nasal washings and  aspirates are unacceptable for Xpert Xpress SARS-CoV-2/FLU/RSV  testing.  Fact Sheet for Patients: PinkCheek.be  Fact Sheet for Healthcare Providers: GravelBags.it  This test is not yet approved or cleared by the [place] FDA and  has been authorized for detection and/or diagnosis of SARS-CoV-2 by  FDA under an Emergency Use Authorization (EUA). This EUA will remain  in effect (meaning this test can be used) for the duration of the  Covid-19 declaration under Section 564(b)(1) of the Act, 21  U.S.C. section 360bbb-3(b)(1), unless the authorization is  terminated or revoked. Performed at [facility], [address], [place], [place] [address]   Urine Culture     Status: Abnormal   Collection Time: 04/19/20  2:47 PM   Specimen: Urine, Random  Result Value Ref Range Status   Specimen Description   Final    URINE, RANDOM Performed at [facility], [name] [place], [place] [address]     Special Requests   Final    NONE Performed at [facility], [address] [place], [place] [address]    Culture >=100,000 COLONIES/mL ESCHERICHIA COLI (A)  Final   Report Status 04/21/2020 FINAL  Final   Organism ID, Bacteria ESCHERICHIA COLI (A)  Final      Susceptibility   Escherichia coli - MIC*    AMPICILLIN >=32 RESISTANT Resistant  CEFAZOLIN <=4 SENSITIVE Sensitive     CEFTRIAXONE <=0.25 SENSITIVE Sensitive     CIPROFLOXACIN >=4 RESISTANT Resistant     GENTAMICIN <=1 SENSITIVE Sensitive     IMIPENEM <=0.25 SENSITIVE Sensitive     NITROFURANTOIN <=16 SENSITIVE Sensitive     TRIMETH/SULFA <=20 SENSITIVE Sensitive     AMPICILLIN/SULBACTAM 16 INTERMEDIATE Intermediate     PIP/TAZO <=4 SENSITIVE Sensitive     * >=100,000 COLONIES/mL ESCHERICHIA COLI  MRSA PCR Screening     Status: None   Collection Time: 04/20/20  4:49 PM   Specimen: Nasopharyngeal  Result Value Ref Range Status   MRSA by PCR NEGATIVE NEGATIVE Final    Comment:        The GeneXpert MRSA Assay (FDA approved for NASAL specimens only), is one component of a comprehensive MRSA colonization surveillance program. It is not intended to diagnose MRSA infection nor to guide or monitor treatment for MRSA infections. Performed at [facility], [address] [place], [place] [address]   CULTURE, BLOOD (ROUTINE X 2) w Reflex to ID Panel     Status: None (Preliminary result)   Collection Time: 04/22/20  5:23 AM   Specimen: BLOOD  Result Value Ref Range Status   Specimen Description BLOOD BLOOD LEFT FOREARM  Final   Special Requests   Final    BOTTLES DRAWN AEROBIC AND ANAEROBIC Blood Culture adequate volume   Culture   Final    NO GROWTH 2 DAYS Performed at [facility], [address] [place], [place] [address]    Report Status PENDING  Incomplete  CULTURE, BLOOD (ROUTINE X 2) w Reflex to ID Panel     Status: None (Preliminary result)   Collection Time: 04/22/20  5:33 AM    Specimen: BLOOD  Result Value Ref Range Status   Specimen Description BLOOD BLOOD LEFT WRIST  Final   Special Requests   Final    BOTTLES DRAWN AEROBIC AND ANAEROBIC Blood Culture results may not be optimal due to an inadequate volume of blood received in culture bottles   Culture   Final    NO GROWTH 2 DAYS Performed at [facility], [address] [place], [place] [address]    Report Status PENDING  Incomplete     Studies: No results found.  Scheduled Meds: . neomycin-polymyxin b-dexamethasone  1 application Left Eye QHS  . timolol  1 drop Both Eyes BID   Continuous Infusions:  Assessment/Plan:  1. End-of-life care.  Patient made comfort care measures as needed as needed medications.  Currently no hospice home bed availability today.  Potentially to hospice home tomorrow.  Failed swallow evaluation. 2. E. coli UTI positive blood culture with 1 bottle staph epidermidis.  Repeat blood cultures negative.  Antibiotics stopped yesterday. 3. Acute metabolic encephalopathy.  Patient's mental status still impaired. 4. Acute kidney injury on chronic kidney disease stage III. 5. Hypernatremia and hypokalemia 6. Hypercalcemia on presentation 7. Stage II decubiti described below  Pressure Injury 04/20/20 Buttocks Mid Stage 2 -  Partial thickness loss of dermis presenting as a shallow open injury with a red, pink wound bed without slough. (Active)  04/20/20 1630  Location: Buttocks  Location Orientation: Mid  Staging: Stage 2 -  Partial thickness loss of dermis presenting as a shallow open injury with a red, pink wound bed without slough.  Wound Description (Comments):   Present on Admission: Yes       Code Status:     Code Status Orders  (  From admission, onward)         Start     Ordered   04/23/20 1634  Do not attempt resuscitation (DNR)  Continuous       Question Answer Comment  In the event of cardiac or respiratory ARREST Do not call a "code blue"   In the  event of cardiac or respiratory ARREST Do not perform Intubation, CPR, defibrillation or ACLS   In the event of cardiac or respiratory ARREST Use medication by any route, position, wound care, and other measures to relive pain and suffering. May use oxygen, suction and manual treatment of airway obstruction as needed for comfort.   Comments nurse may pronounce      04/23/20 1634        Code Status History    Date Active Date Inactive Code Status Order ID Comments User Context   04/19/2020 1515 04/23/2020 1634 DNR [id number]  Loletha Grayer, MD ED   Advance Care Planning Activity     Family Communication: Left message for both son and daughter on the phone Disposition Plan: Status is: Inpatient  Dispo: The patient is from: Assisted living              Anticipated d/c is to: Hospice home              Anticipated d/c date is: Hospice home when bed available              Patient currently started comfort care measures yesterday afternoon.  Awaiting hospice home bed availability  Time spent: 24 minutes.  Case discussed with nursing staff and hospice staff.  [place]  [facility] [employer]

## 2020-04-25 DIAGNOSIS — N390 Urinary tract infection, site not specified: Secondary | ICD-10-CM | POA: Diagnosis not present

## 2020-04-25 DIAGNOSIS — L89302 Pressure ulcer of unspecified buttock, stage 2: Secondary | ICD-10-CM | POA: Diagnosis not present

## 2020-04-25 DIAGNOSIS — Z515 Encounter for palliative care: Secondary | ICD-10-CM | POA: Diagnosis not present

## 2020-04-25 DIAGNOSIS — N179 Acute kidney failure, unspecified: Secondary | ICD-10-CM | POA: Diagnosis not present

## 2020-04-25 NOTE — Progress Notes (Signed)
[facility] NAME: Jeremy Le    MR#:  [id number]  DATE OF BIRTH:  [birth year]  SUBJECTIVE:   Patient resting quietly. Son at bedside. Tells me no issues so far with patient. Continue comfort care measures. No beds yet at hospice home REVIEW OF SYSTEMS:   Review of Systems  Unable to perform ROS: Other  comfort care Tolerating Diet: Tolerating PT:   DRUG ALLERGIES:  No Known Allergies  VITALS:  Blood pressure (!) 144/50, pulse 95, temperature 98 F (36.7 C), temperature source Oral, resp. rate (!) 23, height 5\' 10"  (1.778 m), weight 77.1 kg, SpO2 91 %.  PHYSICAL EXAMINATION:   Physical Exam  GENERAL:  86 y.o.-year-old patient lying in the bed with no acute distress.   LUNGS: Normal breath sounds bilaterally, no wheezing, rales, rhonchi. No use of accessory muscles of respiration.  CARDIOVASCULAR: S1, S2 normal. No murmurs, rubs, or gallops.  PSYCHIATRIC:  patient is resting, letharigc limited exam secondary to comfort care measures.  LABORATORY PANEL:  CBC Recent Labs  Lab 04/22/20 0523  WBC 10.7*  HGB 12.1*  HCT 35.2*  PLT 268    Chemistries  Recent Labs  Lab 04/22/20 0523  NA 153*  K 3.2*  CL 118*  CO2 26  GLUCOSE 226*  BUN 39*  CREATININE 1.24  CALCIUM 9.4  AST 18  ALT 15  ALKPHOS 75  BILITOT 1.0   Cardiac Enzymes No results for input(s): TROPONINI in the last 168 hours. RADIOLOGY:  No results found. ASSESSMENT AND PLAN:  Jeremy Le  is a 86 y.o. male brought in with altered mental status. He recently was moved over to [facility] assisted living.Hospitalist services were contacted for further evaluation when his kidney function was worse than usual at 1.7. His calcium was elevated and sodium elevated.  1.End-of-life care.  Patient made comfort care measures as needed  -prn morphine as needed medications.  Currently no hospice home bed availability today. -Patient seems to be resting well.  Son at bedside. -Scopolamine patch placed for secretions  2.E. coli UTI positive blood culture with 1 bottle staph epidermidis.  Repeat blood cultures negative.  Antibiotics stopped once patient was transition to comfort care  3.Acute metabolic encephalopathy.  Patient's mental status still impaired. Suspected due to overall decline with dehydration, hypercalcemia, UTI  4.Acute kidney injury on chronic kidney disease stage III.  5.Hypernatremia /hypokalemia/ Hypercalcemia on presentation-- initially received fluids however now on comfort care.  6. Stage II decubiti described below  Pressure Injury 04/20/20 Buttocks Mid Stage 2 -  Partial thickness loss of dermis presenting as a shallow open injury with a red, pink wound bed without slough. (Active)  04/20/20 1630  Location: Buttocks  Location Orientation: Mid  Staging: Stage 2 -  Partial thickness loss of dermis presenting as a shallow open injury with a red, pink wound bed without slough.  Wound Description (Comments):   Present on Admission: Yes    Procedures:none Family communication : son at bedside Consults : CODE STATUS: DNR DVT Prophylaxis :none--pt comfort care  Status is: Inpatient  Remains inpatient appropriate because:Awaiting hospice bed availability   Dispo: The patient is from: ALF              Anticipated d/c is to: Hospice home when beds available              Anticipated d/c date is: 1 day  Patient currently is medically stable to d/c. to hospice home when bed available       TOTAL TIME TAKING CARE OF THIS PATIENT: *20* minutes.  >50% time spent on counselling and coordination of care  Note: This dictation was prepared with Dragon dictation along with smaller phrase technology. Any transcriptional errors that result from this process are unintentional.  Fritzi Mandes M.D    [facility]   CC: Primary care physician; Marinda Elk, MDPatient ID: Jeremy Cornfield., male   DOB:  07-19-1922, 86 y.o.   MRN: [id number]

## 2020-04-25 NOTE — Plan of Care (Signed)
  Problem: Pain Managment: Goal: General experience of comfort will improve Outcome: Progressing   

## 2020-04-25 NOTE — Progress Notes (Signed)
[facility]) Hospital Liaison RN note:  Visited with patient in room. He appears to be resting comfortably. Spoke with son over the phone to update him regarding his father and bed availability.  Unfortunately, Hospice Home does not have a bed available to offer today. Hospital care team is aware. [facility] Liaison will continue to follow for room availability.   Please call with any hospice related questions or concerns.  Thank you.  Zandra Abts, RN [facility] Liaison [phone]

## 2020-04-26 DIAGNOSIS — N390 Urinary tract infection, site not specified: Secondary | ICD-10-CM | POA: Diagnosis not present

## 2020-04-26 DIAGNOSIS — N179 Acute kidney failure, unspecified: Secondary | ICD-10-CM | POA: Diagnosis not present

## 2020-04-26 DIAGNOSIS — Z515 Encounter for palliative care: Secondary | ICD-10-CM | POA: Diagnosis not present

## 2020-04-26 DIAGNOSIS — L89302 Pressure ulcer of unspecified buttock, stage 2: Secondary | ICD-10-CM | POA: Diagnosis not present

## 2020-04-26 NOTE — Progress Notes (Signed)
[facility]) Hospital Liaison RN note:  Visited with patient in room. He is resting comfortably. Spoke with son, Dyami on the phone to update him. He expressed that again that he just wanted his father to be comfortable. He said that he was grimacing some while he was there and had some secretions. [place] RN notified nurse, Bet of family concerns and she will address.  Unfortunately, Hospice Home does not have any beds available to offer today. Family and hospital care team is aware. [facility] Liaison will continue to follow for bed availability.  Please call with any hospice related questions or concerns.  Zandra Abts, RN [facility] Liaison [phone]

## 2020-04-26 NOTE — Care Management Important Message (Signed)
Important Message  Patient Details  Name: Jeremy Le. MRN: [id number] Date of Birth: [birth year]   Medicare Important Message Given:  Yes     Dannette Barbara 04/26/2020, 12:17 PM

## 2020-04-26 NOTE — Progress Notes (Signed)
[facility] [place] NAME: Jeremy Le    MR#:  [id number]  DATE OF BIRTH:  Dec 16, 1921  SUBJECTIVE:   Patient resting quietly. No new issues per RN No family in the room at present Continue comfort care measures. No beds yet at hospice home as of this am REVIEW OF SYSTEMS:   Review of Systems  Unable to perform ROS: Other  comfort care  DRUG ALLERGIES:  No Known Allergies  VITALS:  Blood pressure 132/67, pulse (!) 101, temperature 98.7 F (37.1 C), temperature source Oral, resp. rate (!) 22, height 5\' 10"  (1.778 m), weight 77.1 kg, SpO2 93 %.  PHYSICAL EXAMINATION:   Physical Exam  GENERAL:  86 y.o.-year-old patient lying in the bed with no acute distress.   LUNGS: Normal breath sounds bilaterally, no wheezing, rales, rhonchi. No use of accessory muscles of respiration.  CARDIOVASCULAR: S1, S2 normal. No murmurs, rubs, or gallops.  PSYCHIATRIC:  patient is resting, letharigc limited exam secondary to comfort care measures.  LABORATORY PANEL:  CBC Recent Labs  Lab 04/22/20 0523  WBC 10.7*  HGB 12.1*  HCT 35.2*  PLT 268    Chemistries  Recent Labs  Lab 04/22/20 0523  NA 153*  K 3.2*  CL 118*  CO2 26  GLUCOSE 226*  BUN 39*  CREATININE 1.24  CALCIUM 9.4  AST 18  ALT 15  ALKPHOS 75  BILITOT 1.0   Cardiac Enzymes No results for input(s): TROPONINI in the last 168 hours. RADIOLOGY:  No results found. ASSESSMENT AND PLAN:  Jeremy Le  is a 86 y.o. male brought in with altered mental status. He recently was moved over to [facility] assisted living.Hospitalist services were contacted for further evaluation when his kidney function was worse than usual at 1.7. His calcium was elevated and sodium elevated.  1.End-of-life care.  Patient made comfort care measures as needed  -prn morphine as needed medications.  Currently no hospice home bed availability today. -Patient seems to be resting well. Son at  bedside. -Scopolamine patch placed for secretions  2.E. coli UTI positive blood culture with 1 bottle staph epidermidis.  Repeat blood cultures negative.  Antibiotics stopped once patient was transition to comfort care  3.Acute metabolic encephalopathy.  Patient's mental status still impaired. Suspected due to overall decline with dehydration, hypercalcemia, UTI  4.Acute kidney injury on chronic kidney disease stage III.  5.Hypernatremia /hypokalemia/ Hypercalcemia on presentation-- initially received fluids however now on comfort care.  6. Stage II decubiti described below  Pressure Injury 04/20/20 Buttocks Mid Stage 2 -  Partial thickness loss of dermis presenting as a shallow open injury with a red, pink wound bed without slough. (Active)  04/20/20 1630  Location: Buttocks  Location Orientation: Mid  Staging: Stage 2 -  Partial thickness loss of dermis presenting as a shallow open injury with a red, pink wound bed without slough.  Wound Description (Comments):   Present on Admission: Yes    Procedures:none Family communication : left message for son on the phone CODE STATUS: DNR DVT Prophylaxis :none--pt comfort care  Status is: Inpatient  Remains inpatient appropriate because:Awaiting hospice bed availability   Dispo: The patient is from: ALF              Anticipated d/c is to: Hospice home when beds available              Anticipated d/c date is: TBD  Patient currently is ok to transfer to hospice home when bed available       TOTAL TIME TAKING CARE OF THIS PATIENT: *20* minutes.  >50% time spent on counselling and coordination of care  Note: This dictation was prepared with Dragon dictation along with smaller phrase technology. Any transcriptional errors that result from this process are unintentional.  Fritzi Mandes M.D    [facility]   CC: Primary care physician; Marinda Elk, MDPatient ID: Jeremy Le., male   DOB: August 11, 1921,  86 y.o.   MRN: [id number]

## 2020-04-27 DIAGNOSIS — N390 Urinary tract infection, site not specified: Secondary | ICD-10-CM | POA: Diagnosis not present

## 2020-04-27 DIAGNOSIS — L89302 Pressure ulcer of unspecified buttock, stage 2: Secondary | ICD-10-CM | POA: Diagnosis not present

## 2020-04-27 DIAGNOSIS — N179 Acute kidney failure, unspecified: Secondary | ICD-10-CM | POA: Diagnosis not present

## 2020-04-27 DIAGNOSIS — Z515 Encounter for palliative care: Secondary | ICD-10-CM | POA: Diagnosis not present

## 2020-04-27 LAB — CULTURE, BLOOD (ROUTINE X 2)
Culture: NO GROWTH
Culture: NO GROWTH
Special Requests: ADEQUATE

## 2020-04-27 NOTE — Progress Notes (Signed)
Nutrition Brief Note  Nutrition department drawn to pt secondary to NPO/CL x 5 days  Chart reviewed.  Pt has transitioned to comfort care and awaiting transfer to hospice home pending bed availability.  No nutrition interventions warranted at this time. Please consult as needed.    Lajuan Lines, RD, LDN Clinical Nutrition After Hours/Weekend Pager # in [place]

## 2020-04-27 NOTE — Progress Notes (Signed)
[facility] [place] [facility] [place] NAME: Jeremy Le    MR#:  [id number]  DATE OF BIRTH:  01-Feb-1922  SUBJECTIVE:   Patient resting quietly. No new issues per RN No family in the room at present Continue comfort care measures. No beds yet at hospice home as of this am per [facility] REVIEW OF SYSTEMS:   Review of Systems  Unable to perform ROS: Other  comfort care  DRUG ALLERGIES:  No Known Allergies  VITALS:  Blood pressure (!) 153/68, pulse 78, temperature 98.7 F (37.1 C), temperature source Oral, resp. rate (!) 35, height 5\' 10"  (1.778 m), weight 77.1 kg, SpO2 94 %.  PHYSICAL EXAMINATION:   Physical Exam  GENERAL:  86 y.o.-year-old patient lying in the bed with no acute distress.   LUNGS: Normal breath sounds bilaterally, no wheezing, rales, rhonchi. No use of accessory muscles of respiration.  CARDIOVASCULAR: S1, S2 normal. No murmurs, rubs, or gallops.  PSYCHIATRIC:  patient is resting, letharigc limited exam secondary to comfort care measures.  LABORATORY PANEL:  CBC Recent Labs  Lab 04/22/20 0523  WBC 10.7*  HGB 12.1*  HCT 35.2*  PLT 268    Chemistries  Recent Labs  Lab 04/22/20 0523  NA 153*  K 3.2*  CL 118*  CO2 26  GLUCOSE 226*  BUN 39*  CREATININE 1.24  CALCIUM 9.4  AST 18  ALT 15  ALKPHOS 75  BILITOT 1.0   Cardiac Enzymes No results for input(s): TROPONINI in the last 168 hours. RADIOLOGY:  No results found. ASSESSMENT AND PLAN:  Jeremy Le  is a 86 y.o. male brought in with altered mental status. He recently was moved over to [facility] assisted living.Hospitalist services were contacted for further evaluation when his kidney function was worse than usual at 1.7. His calcium was elevated and sodium elevated.  1.End-of-life care.  Patient made comfort care measures as needed  -prn morphine as needed medications.  Currently no hospice home bed availability today. -Patient seems to be resting well.   -Scopolamine patch placed for secretions  2.E. coli UTI positive blood culture with 1 bottle staph epidermidis.  Repeat blood cultures negative.   3.Acute metabolic encephalopathy.  Patient's mental status still impaired. Suspected due to overall decline with dehydration, hypercalcemia, UTI  4.Acute kidney injury on chronic kidney disease stage III.  5.Hypernatremia /hypokalemia/ Hypercalcemia on presentation-- initially received fluids however now on comfort care.  6. Stage II decubiti described below  Pressure Injury 04/20/20 Buttocks Mid Stage 2 -  Partial thickness loss of dermis presenting as a shallow open injury with a red, pink wound bed without slough. (Active)  04/20/20 1630  Location: Buttocks  Location Orientation: Mid  Staging: Stage 2 -  Partial thickness loss of dermis presenting as a shallow open injury with a red, pink wound bed without slough.  Wound Description (Comments):   Present on Admission: Yes    Procedures:none Family communication : left message for son on the phone today CODE STATUS: DNR DVT Prophylaxis :none--pt comfort care  Status is: Inpatient  Remains inpatient appropriate because:Awaiting hospice bed availability   Dispo: The patient is from: ALF              Anticipated d/c is to: Hospice home when beds available              Anticipated d/c date is: TBD              Patient currently is  ok to transfer to hospice home when bed available       TOTAL TIME TAKING CARE OF THIS PATIENT: *15* minutes.  >50% time spent on counselling and coordination of care  Note: This dictation was prepared with Dragon dictation along with smaller phrase technology. Any transcriptional errors that result from this process are unintentional.  Fritzi Mandes M.D    [facility]   CC: Primary care physician; Marinda Elk, MDPatient ID: Jeremy Le., male   DOB: [birth year], 86 y.o.   MRN: [id number]

## 2020-04-27 NOTE — Progress Notes (Signed)
[place] [facility]) Hospital Liaison RN note:  Visited patient in room. He is resting with eyes closed and does not appear to be in any discomfort. Spoke with son, Nason on the phone to update him on bed availability.   Unfortunately, Hospice Home does not have a bed to offer today. Hospital care team is aware. If a bed becomes available over the weekend, the [place] will outreach to weekend CM for arrangements.  If bed becomes available, RN please call report to the [facility] at [phone] and fax discharge summary.    Thank you.  Zandra Abts, RN [facility] Liaison [phone]

## 2020-04-28 DIAGNOSIS — N390 Urinary tract infection, site not specified: Secondary | ICD-10-CM | POA: Diagnosis not present

## 2020-04-28 DIAGNOSIS — N179 Acute kidney failure, unspecified: Secondary | ICD-10-CM | POA: Diagnosis not present

## 2020-04-28 DIAGNOSIS — Z515 Encounter for palliative care: Secondary | ICD-10-CM | POA: Diagnosis not present

## 2020-04-28 DIAGNOSIS — L89302 Pressure ulcer of unspecified buttock, stage 2: Secondary | ICD-10-CM | POA: Diagnosis not present

## 2020-04-28 MED ORDER — MORPHINE SULFATE (PF) 2 MG/ML IV SOLN
2.0000 mg | INTRAVENOUS | Status: DC | PRN
  Administered 2020-04-28 – 2020-04-30 (×3): 2 mg via INTRAVENOUS
  Filled 2020-04-28 (×3): qty 1

## 2020-04-28 NOTE — Progress Notes (Signed)
[facility] [place] [facility] [place] NAME: [name]    MR#:  [id number]  DATE OF BIRTH:  [birth year]  SUBJECTIVE:   Patient resting quietly. No new issues per RN No family in the room at present Continue comfort care measures. No beds yet at hospice home as of this am  REVIEW OF SYSTEMS:   Review of Systems  Unable to perform ROS: Other  comfort care  DRUG ALLERGIES:  No Known Allergies  VITALS:  Blood pressure (!) 153/63, pulse 73, temperature 98.3 F (36.8 C), resp. rate 17, height 5\' 10"  (1.778 m), weight 77.1 kg, SpO2 96 %.  PHYSICAL EXAMINATION:   Physical Exam  GENERAL:  86 y.o.-year-old patient lying in the bed with no acute distress.   LUNGS: Normal breath sounds bilaterally, no wheezing, rales, rhonchi. No use of accessory muscles of respiration.  CARDIOVASCULAR: S1, S2 normal. No murmurs, rubs, or gallops.  PSYCHIATRIC:  patient is resting, letharigc limited exam secondary to comfort care measures.  LABORATORY PANEL:  CBC Recent Labs  Lab 04/22/20 0523  WBC 10.7*  HGB 12.1*  HCT 35.2*  PLT 268    Chemistries  Recent Labs  Lab 04/22/20 0523  NA 153*  K 3.2*  CL 118*  CO2 26  GLUCOSE 226*  BUN 39*  CREATININE 1.24  CALCIUM 9.4  AST 18  ALT 15  ALKPHOS 75  BILITOT 1.0   Cardiac Enzymes No results for input(s): TROPONINI in the last 168 hours. RADIOLOGY:  No results found. ASSESSMENT AND PLAN:  Jeremy Le  is a 86 y.o. male brought in with altered mental status. He recently was moved over to [facility] assisted living.Hospitalist services were contacted for further evaluation when his kidney function was worse than usual at 1.7. His calcium was elevated and sodium elevated.  1.End-of-life care.  Patient made comfort care measures as needed  -prn morphine as needed medications.  Currently no hospice home bed availability today. -Patient seems to be resting well.  -Scopolamine patch placed for secretions 2.E.  coli UTI positive blood culture with 1 bottle staph epidermidis.  3.Acute metabolic encephalopathy.  Patient's mental status still impaired. Suspected due to overall decline with dehydration, hypercalcemia, UTI 4.Acute kidney injury on chronic kidney disease stage III. 5.Hypernatremia /hypokalemia/ Hypercalcemia on presentation-- initially received fluids however now on comfort care. 6. Stage II decubiti described below  Pressure Injury 04/20/20 Buttocks Mid Stage 2 -  Partial thickness loss of dermis presenting as a shallow open injury with a red, pink wound bed without slough. (Active)  04/20/20 1630  Location: Buttocks  Location Orientation: Mid  Staging: Stage 2 -  Partial thickness loss of dermis presenting as a shallow open injury with a red, pink wound bed without slough.  Wound Description (Comments):   Present on Admission: Yes    Procedures:none Family communication : Son Coyt Govoni aware CODE STATUS: DNR DVT Prophylaxis :none--pt comfort care  Status is: Inpatient  Remains inpatient appropriate because:Awaiting hospice bed availability   Dispo: The patient is from: ALF              Anticipated d/c is to: Hospice home when beds available              Anticipated d/c date is: TBD              Patient currently is ok to transfer to hospice home when bed available       TOTAL TIME TAKING CARE  OF THIS PATIENT: *15* minutes.  >50% time spent on counselling and coordination of care  Note: This dictation was prepared with Dragon dictation along with smaller phrase technology. Any transcriptional errors that result from this process are unintentional.  Fritzi Mandes M.D    [facility]   CC: Primary care physician; Marinda Elk, MDPatient ID: Jeremy Le., male   DOB: 1922/06/27, 86 y.o.   MRN: [id number]

## 2020-04-29 DIAGNOSIS — N179 Acute kidney failure, unspecified: Secondary | ICD-10-CM | POA: Diagnosis not present

## 2020-04-29 DIAGNOSIS — L89302 Pressure ulcer of unspecified buttock, stage 2: Secondary | ICD-10-CM | POA: Diagnosis not present

## 2020-04-29 DIAGNOSIS — Z515 Encounter for palliative care: Secondary | ICD-10-CM | POA: Diagnosis not present

## 2020-04-29 DIAGNOSIS — N390 Urinary tract infection, site not specified: Secondary | ICD-10-CM | POA: Diagnosis not present

## 2020-04-29 LAB — PTH-RELATED PEPTIDE: PTH-related peptide: <2 pmol/L

## 2020-04-29 NOTE — Progress Notes (Signed)
[facility] [place] at [place] NAME: Jeremy Le    MR#:  [id number]  DATE OF BIRTH:  05-28-1922  SUBJECTIVE:   Patient resting quietly. No new issues per RN No family in the room at present Continue comfort care measures.  REVIEW OF SYSTEMS:   Review of Systems  Unable to perform ROS: Other  comfort care  DRUG ALLERGIES:  No Known Allergies  VITALS:  Blood pressure 140/77, pulse 63, temperature 98.6 F (37 C), resp. rate 18, height 5\' 10"  (1.778 m), weight 77.1 kg, SpO2 97 %.  PHYSICAL EXAMINATION:   Physical Exam  GENERAL:  86 y.o.-year-old patient lying in the bed with no acute distress.   LUNGS: Normal breath sounds bilaterally, no wheezing, rales, rhonchi. No use of accessory muscles of respiration.  CARDIOVASCULAR: S1, S2 normal. No murmurs, rubs, or gallops.  PSYCHIATRIC:  patient is resting, letharigc limited exam secondary to comfort care measures.  LABORATORY PANEL:  CBC No results for input(s): WBC, HGB, HCT, PLT in the last 168 hours.  Chemistries  No results for input(s): NA, K, CL, CO2, GLUCOSE, BUN, CREATININE, CALCIUM, MG, AST, ALT, ALKPHOS, BILITOT in the last 168 hours.  Invalid input(s): GFRCGP Cardiac Enzymes No results for input(s): TROPONINI in the last 168 hours. RADIOLOGY:  No results found. ASSESSMENT AND PLAN:  Jeremy Le  is a 86 y.o. male brought in with altered mental status. He recently was moved over to [facility] assisted living.Hospitalist services were contacted for further evaluation when his kidney function was worse than usual at 1.7. His calcium was elevated and sodium elevated.  1.End-of-life care.  Patient made comfort care measures as needed  -prn morphine as needed medications.  Currently no hospice home bed availability today. -Patient seems to be resting well.  -Scopolamine patch placed for secretions 2.E. coli UTI positive blood culture with 1 bottle staph epidermidis.  3.Acute  metabolic encephalopathy.  Patient's mental status still impaired. Suspected due to overall decline with dehydration, hypercalcemia, UTI 4.Acute kidney injury on chronic kidney disease stage III. 5.Hypernatremia /hypokalemia/ Hypercalcemia on presentation-- initially received fluids however now on comfort care. 6. Stage II decubiti described below  Pressure Injury 04/20/20 Buttocks Mid Stage 2 -  Partial thickness loss of dermis presenting as a shallow open injury with a red, pink wound bed without slough. (Active)  04/20/20 1630  Location: Buttocks  Location Orientation: Mid  Staging: Stage 2 -  Partial thickness loss of dermis presenting as a shallow open injury with a red, pink wound bed without slough.  Wound Description (Comments):   Present on Admission: Yes    Procedures:none Family communication : Son Jeremy Le aware CODE STATUS: DNR DVT Prophylaxis :none--pt comfort care  Status is: Inpatient  Remains inpatient appropriate because:Awaiting hospice bed availability   Dispo: The patient is from: ALF              Anticipated d/c is to: Hospice home when beds available              Anticipated d/c date is: TBD              Patient currently is ok to transfer to hospice home when bed available       TOTAL TIME TAKING CARE OF THIS PATIENT: *15* minutes.  >50% time spent on counselling and coordination of care  Note: This dictation was prepared with Dragon dictation along with smaller phrase technology. Any transcriptional errors that result from this  process are unintentional.  Fritzi Mandes M.D    [facility]   CC: Primary care physician; Marinda Elk, MDPatient ID: Jeremy Le., male   DOB: [birth year], 86 y.o.   MRN: [id number]

## 2020-04-30 ENCOUNTER — Other Ambulatory Visit: Payer: Self-pay

## 2020-04-30 DIAGNOSIS — R4182 Altered mental status, unspecified: Secondary | ICD-10-CM

## 2020-04-30 MED ORDER — HALOPERIDOL LACTATE 2 MG/ML PO CONC: 0.5000 mg | ORAL | 0 refills | Status: AC | PRN

## 2020-04-30 MED ORDER — SCOPOLAMINE 1 MG/3DAYS TD PT72: 1.0000 | MEDICATED_PATCH | TRANSDERMAL | 12 refills | Status: AC

## 2020-04-30 MED ORDER — MORPHINE SULFATE (CONCENTRATE) 10 MG/0.5ML PO SOLN
5.0000 mg | ORAL | 0 refills | Status: AC | PRN
Start: 2020-04-30 — End: ?

## 2020-04-30 MED ORDER — GLYCOPYRROLATE 1 MG PO TABS: 1.0000 mg | ORAL_TABLET | ORAL | 0 refills | Status: AC | PRN

## 2020-04-30 NOTE — Care Management Important Message (Signed)
Important Message  Patient Details  Name: Jeremy Le. MRN: [id number] Date of Birth: 1921-09-14   Medicare Important Message Given:  Yes     Dannette Barbara 04/30/2020, 10:49 AM

## 2020-04-30 NOTE — Discharge Summary (Signed)
[place] at [place] NAME: Jeremy Le    MR#:  [id number]  DATE OF BIRTH:  Dec 11, 1921  DATE OF ADMISSION:  04/19/2020 ADMITTING PHYSICIAN: Jeremy Grayer, MD  DATE OF DISCHARGE: 04/30/2020  PRIMARY CARE PHYSICIAN: Jeremy Elk, MD    ADMISSION DIAGNOSIS:  Acute kidney injury ([place]) [N17.9] E. coli urinary tract infection [N39.0, B96.20] Altered mental status, unspecified altered mental status type [R41.82]  DISCHARGE DIAGNOSIS:  End of Life care E coli UTI Acute metabolic encephalopathy--pt transtioned to comfort care SECONDARY DIAGNOSIS:   Past Medical History:  Diagnosis Date  . Angiopathy   . Diabetes mellitus type II, non insulin dependent ([place])   . Diabetic peripheral neuropathy ([place])   . Hammertoe   . Onychomycosis   . Osteoarthritis   . Prostate cancer [name]     HOSPITAL COURSE:   [name] a86 y.o.malebrought in with altered mental status. He recently was moved over to [facility] assisted living.Hospitalist services were contacted for further evaluation when his kidney function was worse than usual at 1.7. His calcium was elevated and sodium elevated.  1.End-of-life care. Patient made comfort care measures as needed  -prn morphine as needed medications. Currently no hospice home bed availability today. -Patient seems to be resting well.  -Scopolamine patch placed for secretions 2.E. coli UTI positive blood culture with 1 bottle staph epidermidis.  3.Acute metabolic encephalopathy. Patient's mental status still impaired. Suspected due to overall decline with dehydration, hypercalcemia, UTI 4.Acute kidney injury on chronic kidney disease stage III. 5.Hypernatremia /hypokalemia/ Hypercalcemia on presentation-- initially received fluids however now on comfort care. 6. Stage II decubiti described below  Pressure Injury 04/20/20 Buttocks Mid Stage 2 - Partial thickness loss of dermis presenting as a  shallow open injury with a red, pink wound bed without slough. (Active)  04/20/20 1630  Location: Buttocks  Location Orientation: Mid  Staging: Stage 2 - Partial thickness loss of dermis presenting as a shallow open injury with a red, pink wound bed without slough.  Wound Description (Comments):   Present on Admission: Yes    Procedures:none Family communication : Son Jeremy Le in the room today CODE STATUS: DNR DVT Prophylaxis :none--pt comfort care  Status is: Inpatient  Remains inpatient appropriate because:Awaiting hospice bed availability   Dispo: The patient is from: ALF  Anticipated d/c is to: Hospice home   Anticipated d/c date is today  Patient currently is ok to transfer to hospice home when bed available    CONSULTS OBTAINED:    DRUG ALLERGIES:  No Known Allergies  DISCHARGE MEDICATIONS:   Allergies as of 04/30/2020   No Known Allergies     Medication List    STOP taking these medications   ascorbic acid 500 MG tablet Commonly known as: VITAMIN C   atorvastatin 10 MG tablet Commonly known as: LIPITOR   buPROPion 150 MG 24 hr tablet Commonly known as: WELLBUTRIN XL   CENTRUM SILVER 50+MEN PO   Debrox 6.5 % OTIC solution Generic drug: carbamide peroxide   lithium carbonate 150 MG capsule   losartan 25 MG tablet Commonly known as: COZAAR   miconazole 2 % cream Commonly known as: MICOTIN   neomycin-polymyxin b-dexamethasone 3.5-10000-0.1 Oint Commonly known as: MAXITROL   omeprazole 20 MG capsule Commonly known as: PRILOSEC   polyethylene glycol 17 g packet Commonly known as: MIRALAX / GLYCOLAX   PRESERVISION AREDS 2 PO   timolol 0.5 % ophthalmic solution Commonly known as: TIMOPTIC   vitamin  E 180 MG (400 UNITS) capsule   zinc gluconate 50 MG tablet     TAKE these medications   glycopyrrolate 1 MG tablet Commonly known as: ROBINUL Take 1 tablet (1 mg total) by mouth every 4  (four) hours as needed (excessive secretions).   haloperidol 2 MG/ML solution Commonly known as: HALDOL Place 0.3 mLs (0.6 mg total) under the tongue every 4 (four) hours as needed for agitation (or delirium).   morphine CONCENTRATE 10 MG/0.5ML Soln concentrated solution Take 0.25 mLs (5 mg total) by mouth every 2 (two) hours as needed for moderate pain (or dyspnea).   scopolamine 1 MG/3DAYS Commonly known as: TRANSDERM-SCOP Place 1 patch (1.5 mg total) onto the skin every 3 (three) days.            Discharge Care Instructions  (From admission, onward)         Start     Ordered   04/30/20 0000  Discharge wound care:       Comments: Foam dressing as needed   04/30/20 1016          If you experience worsening of your admission symptoms, develop shortness of breath, life threatening emergency, suicidal or homicidal thoughts you must seek medical attention immediately by calling 911 or calling your MD immediately  if symptoms less severe.  You Must read complete instructions/literature along with all the possible adverse reactions/side effects for all the Medicines you take and that have been prescribed to you. Take any new Medicines after you have completely understood and accept all the possible adverse reactions/side effects.   Please note  You were cared for by a hospitalist during your hospital stay. If you have any questions about your discharge medications or the care you received while you were in the hospital after you are discharged, you can call the unit and asked to speak with the hospitalist on call if the hospitalist that took care of you is not available. Once you are discharged, your primary care physician will handle any further medical issues. Please note that NO REFILLS for any discharge medications will be authorized once you are discharged, as it is imperative that you return to your primary care physician (or establish a relationship with a primary care  physician if you do not have one) for your aftercare needs so that they can reassess your need for medications and monitor your lab values. Today   SUBJECTIVE   Remains comfortable  VITAL SIGNS:  Blood pressure (!) 147/120, pulse 69, temperature 98.2 F (36.8 C), temperature source Oral, resp. rate 15, height 5\' 10"  (1.778 m), weight 77.1 kg, SpO2 93 %.  I/O:  No intake or output data in the 24 hours ending 04/30/20 1017  PHYSICAL EXAMINATION:   GENERAL:  86 y.o.-year-old patient lying in the bed with no acute distress.   LUNGS: Normal breath sounds bilaterally, no wheezing, rales, rhonchi. No use of accessory muscles of respiration.  CARDIOVASCULAR: S1, S2 normal. No murmurs, rubs, or gallops.  PSYCHIATRIC:  patient is resting, letharigc limited exam secondary to comfort care measures. DATA REVIEW:   CBC  No results for input(s): WBC, HGB, HCT, PLT in the last 168 hours.  Chemistries  No results for input(s): NA, K, CL, CO2, GLUCOSE, BUN, CREATININE, CALCIUM, MG, AST, ALT, ALKPHOS, BILITOT in the last 168 hours.  Invalid input(s): [place]  Microbiology Results   Recent Results (from the past 240 hour(s))  MRSA PCR Screening     Status: None  Collection Time: 04/20/20  4:49 PM   Specimen: Nasopharyngeal  Result Value Ref Range Status   MRSA by PCR NEGATIVE NEGATIVE Final    Comment:        The GeneXpert MRSA Assay (FDA approved for NASAL specimens only), is one component of a comprehensive MRSA colonization surveillance program. It is not intended to diagnose MRSA infection nor to guide or monitor treatment for MRSA infections. Performed at [facility], [address] [place], [place] [address]   CULTURE, BLOOD (ROUTINE X 2) w Reflex to ID Panel     Status: None   Collection Time: 04/22/20  5:23 AM   Specimen: BLOOD  Result Value Ref Range Status   Specimen Description BLOOD BLOOD LEFT FOREARM  Final   Special Requests   Final    BOTTLES DRAWN  AEROBIC AND ANAEROBIC Blood Culture adequate volume   Culture   Final    NO GROWTH 5 DAYS Performed at [facility], [address] [place], [place] [address]    Report Status 04/27/2020 FINAL  Final  CULTURE, BLOOD (ROUTINE X 2) w Reflex to ID Panel     Status: None   Collection Time: 04/22/20  5:33 AM   Specimen: BLOOD  Result Value Ref Range Status   Specimen Description BLOOD BLOOD LEFT WRIST  Final   Special Requests   Final    BOTTLES DRAWN AEROBIC AND ANAEROBIC Blood Culture results may not be optimal due to an inadequate volume of blood received in culture bottles   Culture   Final    NO GROWTH 5 DAYS Performed at [facility], [address], [place],  [address]    Report Status 04/27/2020 FINAL  Final    RADIOLOGY:  No results found.   CODE STATUS:     Code Status Orders  (From admission, onward)         Start     Ordered   04/23/20 1634  Do not attempt resuscitation (DNR)  Continuous       Question Answer Comment  In the event of cardiac or respiratory ARREST Do not call a "code blue"   In the event of cardiac or respiratory ARREST Do not perform Intubation, CPR, defibrillation or ACLS   In the event of cardiac or respiratory ARREST Use medication by any route, position, wound care, and other measures to relive pain and suffering. May use oxygen, suction and manual treatment of airway obstruction as needed for comfort.   Comments nurse may pronounce      04/23/20 1634        Code Status History    Date Active Date Inactive Code Status Order ID Comments User Context   04/19/2020 1515 04/23/2020 1634 DNR [id number]  Jeremy Grayer, MD ED   Advance Care Planning Activity       TOTAL TIME TAKING CARE OF THIS PATIENT: **35* minutes.    Fritzi Mandes M.D  [facility]    CC: Primary care physician; Jeremy Elk, MD

## 2020-04-30 NOTE — Progress Notes (Signed)
[facility]) Hospital Liaison RN note:  [facility] does have a bed available to offer patient today. Spoke with son, Dayveon on the phone. He is going to complete paperwork at the [facility] at 1 pm today and transportation is set for 1:30pm. [facility] Liaison will fax the discharge summary. Hospital Care team is aware of above.  Thank you for the opportunity to participate in this patient's care.  Zandra Abts, RN [facility] Liaison [phone]

## 2020-04-30 NOTE — Progress Notes (Signed)
Patient being transported by [facility] to [facility]. Premedicated with morphine for ease of transition.

## 2020-05-18 DIAGNOSIS — R4182 Altered mental status, unspecified: Secondary | ICD-10-CM

## 2020-05-21 DEATH — deceased

## 2020-10-09 IMAGING — CT CT HEAD W/O CM
3 of 4 series · 15 of 47 positions shown, 18 images · non-contrast
Comparison: 11/23/2018

CLINICAL DATA: Unresponsive.

EXAM:
CT HEAD WITHOUT CONTRAST
TECHNIQUE: Contiguous axial images were obtained from the base of the skull
through the vertex without intravenous contrast.

[Series 3: head wo · axial · 0.46mm/px · z∈[-155,-35]mm · 9 of 32 slices shown, 12 images]
[im 4/32  brain]
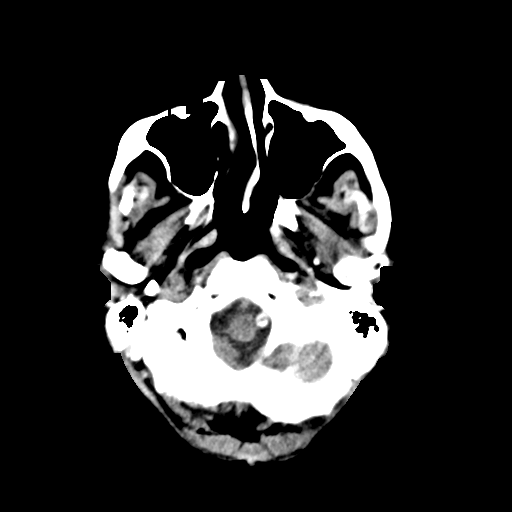
[im 4/32  bone]
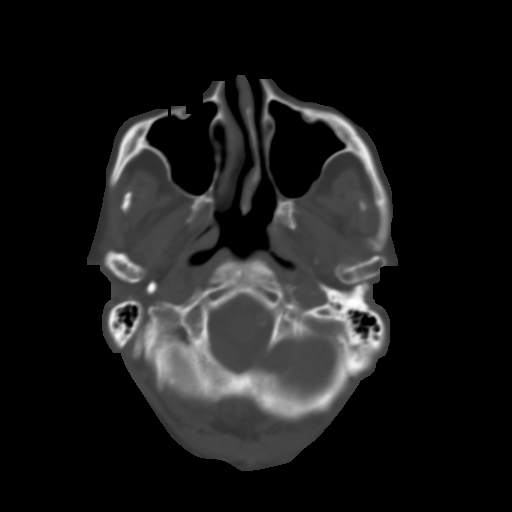
[im 7/32  brain]
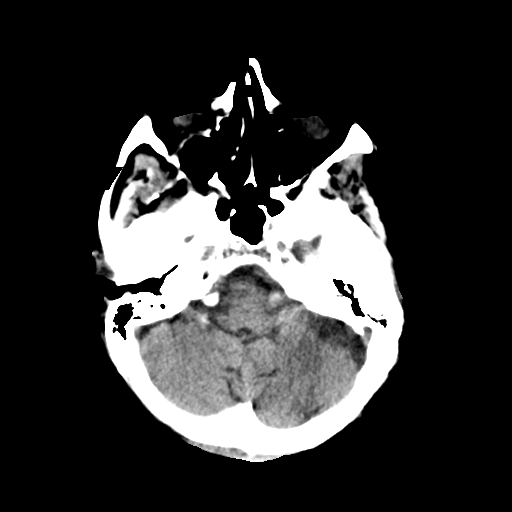
[im 10/32  brain]
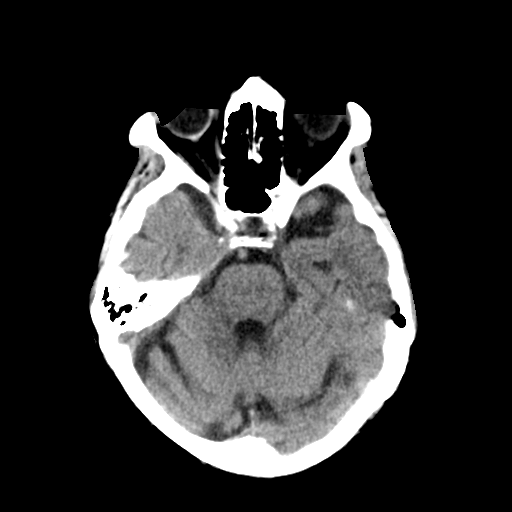
[im 13/32  brain]
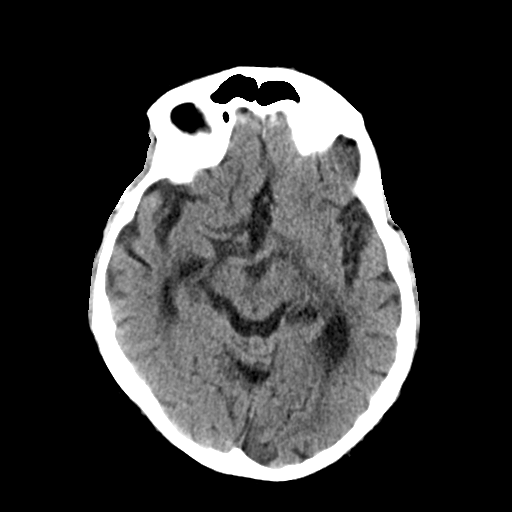
[im 16/32  brain]
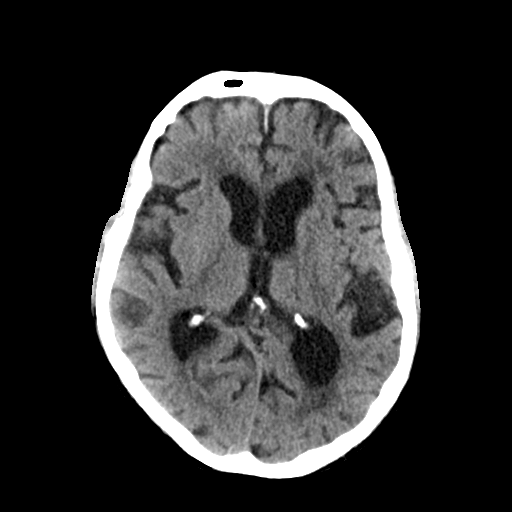
[im 16/32  bone]
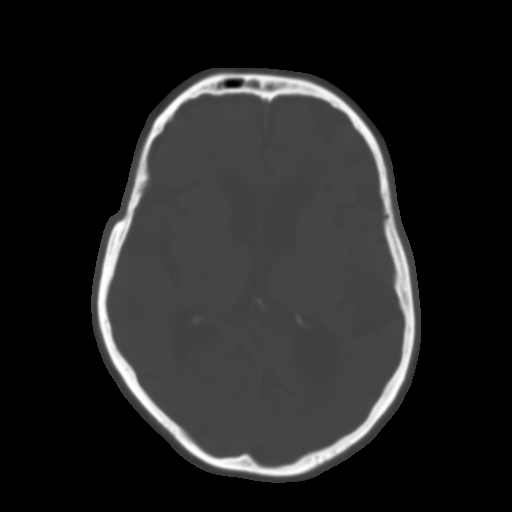
[im 19/32  brain]
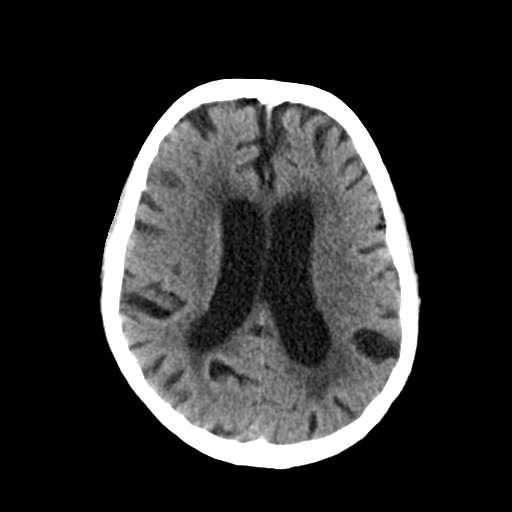
[im 22/32  brain]
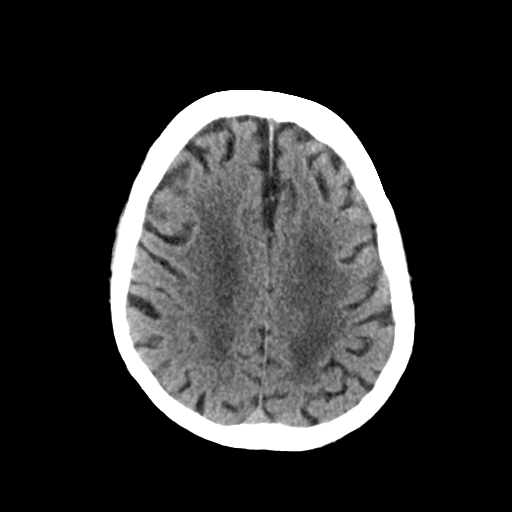
[im 25/32  brain]
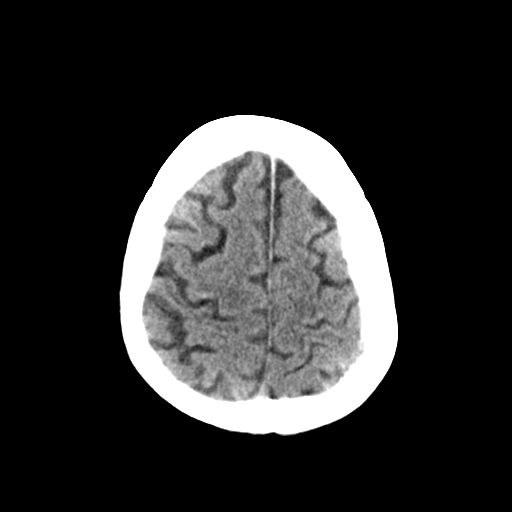
[im 28/32  brain]
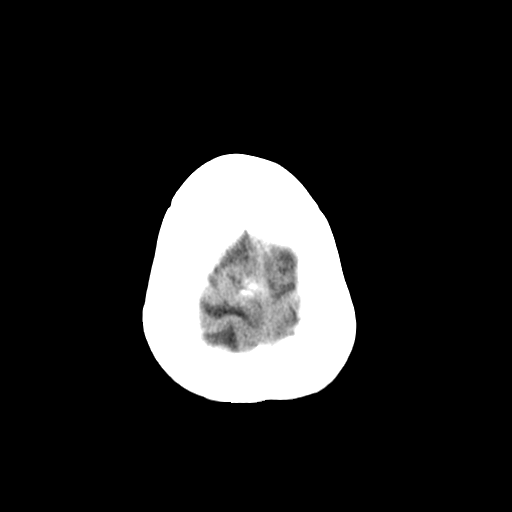
[im 28/32  bone]
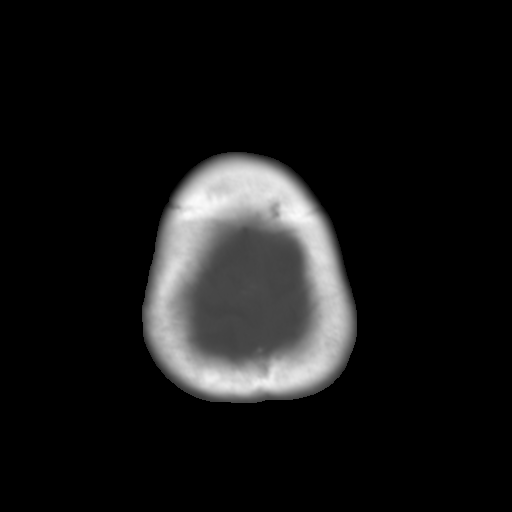

[Series 4: coronal soft tissue · coronal · 0.32mm/px · 3 of 72 slices shown]
[im 24/72  brain]
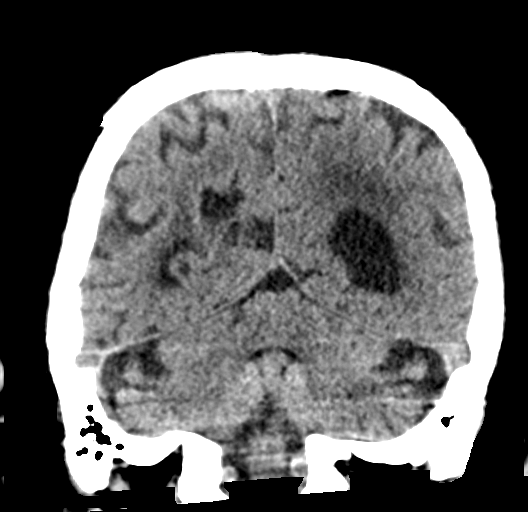
[im 32/72  brain]
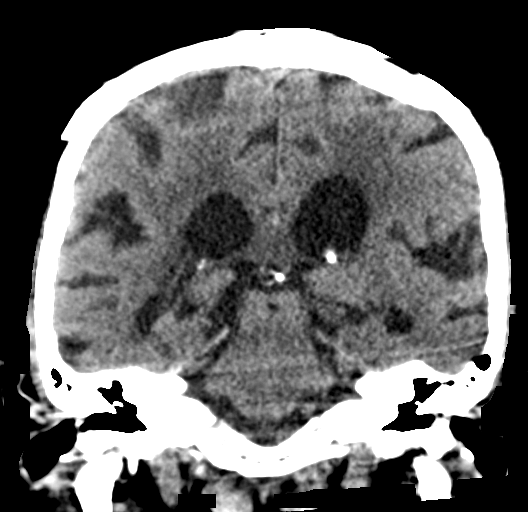
[im 40/72  brain]
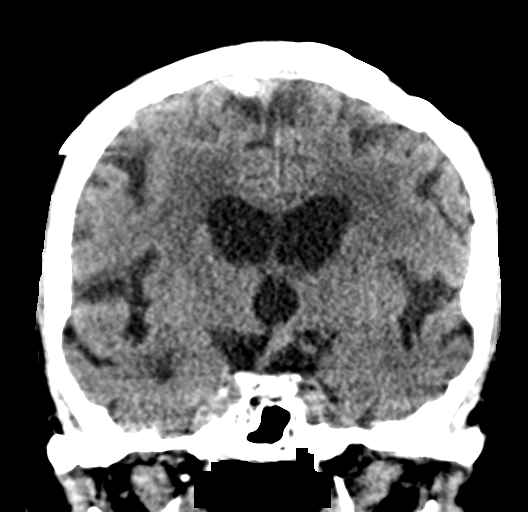

[Series 5: sagittal soft tissue · sagittal · 0.32mm/px · 3 of 56 slices shown]
[im 19/56  brain]
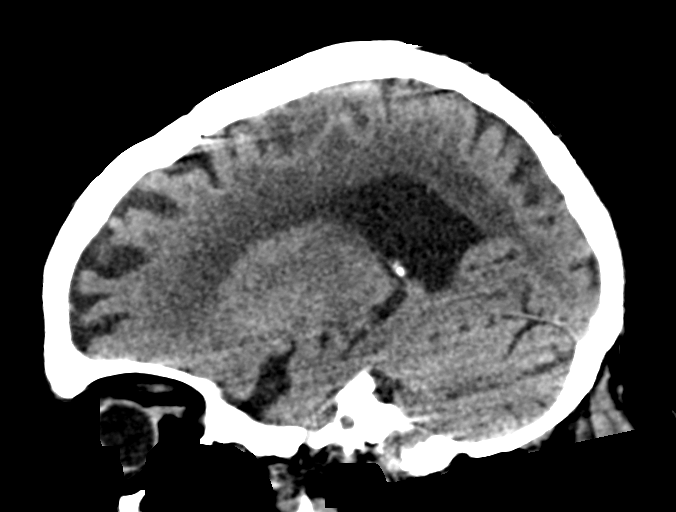
[im 28/56  brain]
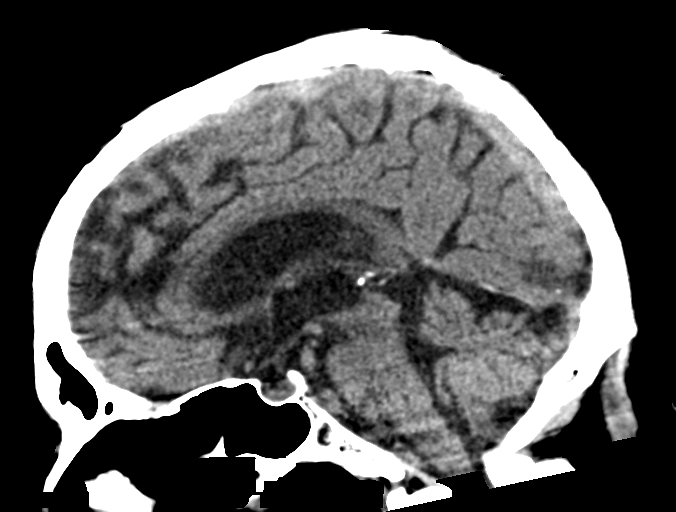
[im 37/56  brain]
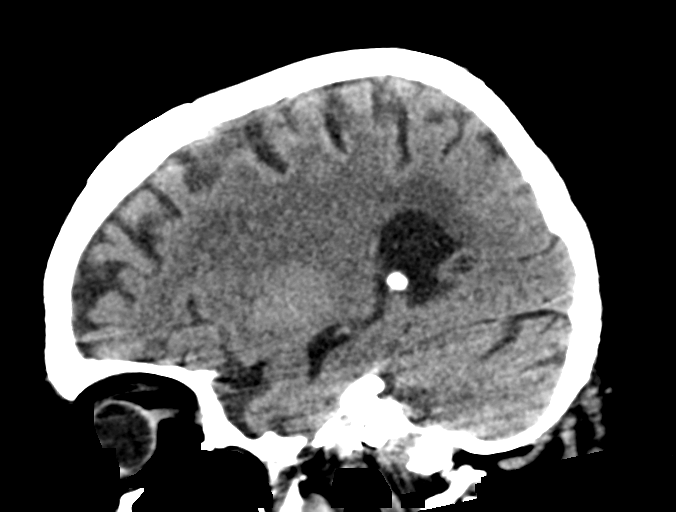

[15 of 47 positions shown; findings below may reference images not displayed]

FINDINGS: Brain: There is no evidence of an acute infarct, intracranial
hemorrhage, midline shift, or extra-axial fluid collection. Patchy
hypodensities in the cerebral white matter bilaterally have mildly
progressed and are nonspecific but compatible with mild-to-moderate
chronic small vessel ischemic disease. Moderate cerebral atrophy has
also likely slightly progressed. A 1.1 cm calcified extra-axial mass
at the vertex to the right of the falx and superior sagittal sinus
is unchanged and likely reflects a small meningioma.

Vascular: Calcified atherosclerosis at the skull base. No hyperdense
vessel.

Skull: No fracture or suspicious osseous lesion.

Sinuses/Orbits: Visualized paranasal sinuses and mastoid air cells
are clear. Bilateral cataract extraction.

Other: None.
IMPRESSION: 1. No evidence of acute intracranial abnormality.
2. Mild-to-moderate chronic small vessel ischemic disease and
cerebral atrophy.
3. Unchanged 1.1 cm right parafalcine meningioma.

## 2020-10-10 IMAGING — DX DG CHEST 1V PORT
1 series · 1 of 1 positions shown · non-contrast
Comparison: December 25, 2007

CLINICAL DATA: Altered mental status

EXAM:
PORTABLE CHEST 1 VIEW

[chest ap]
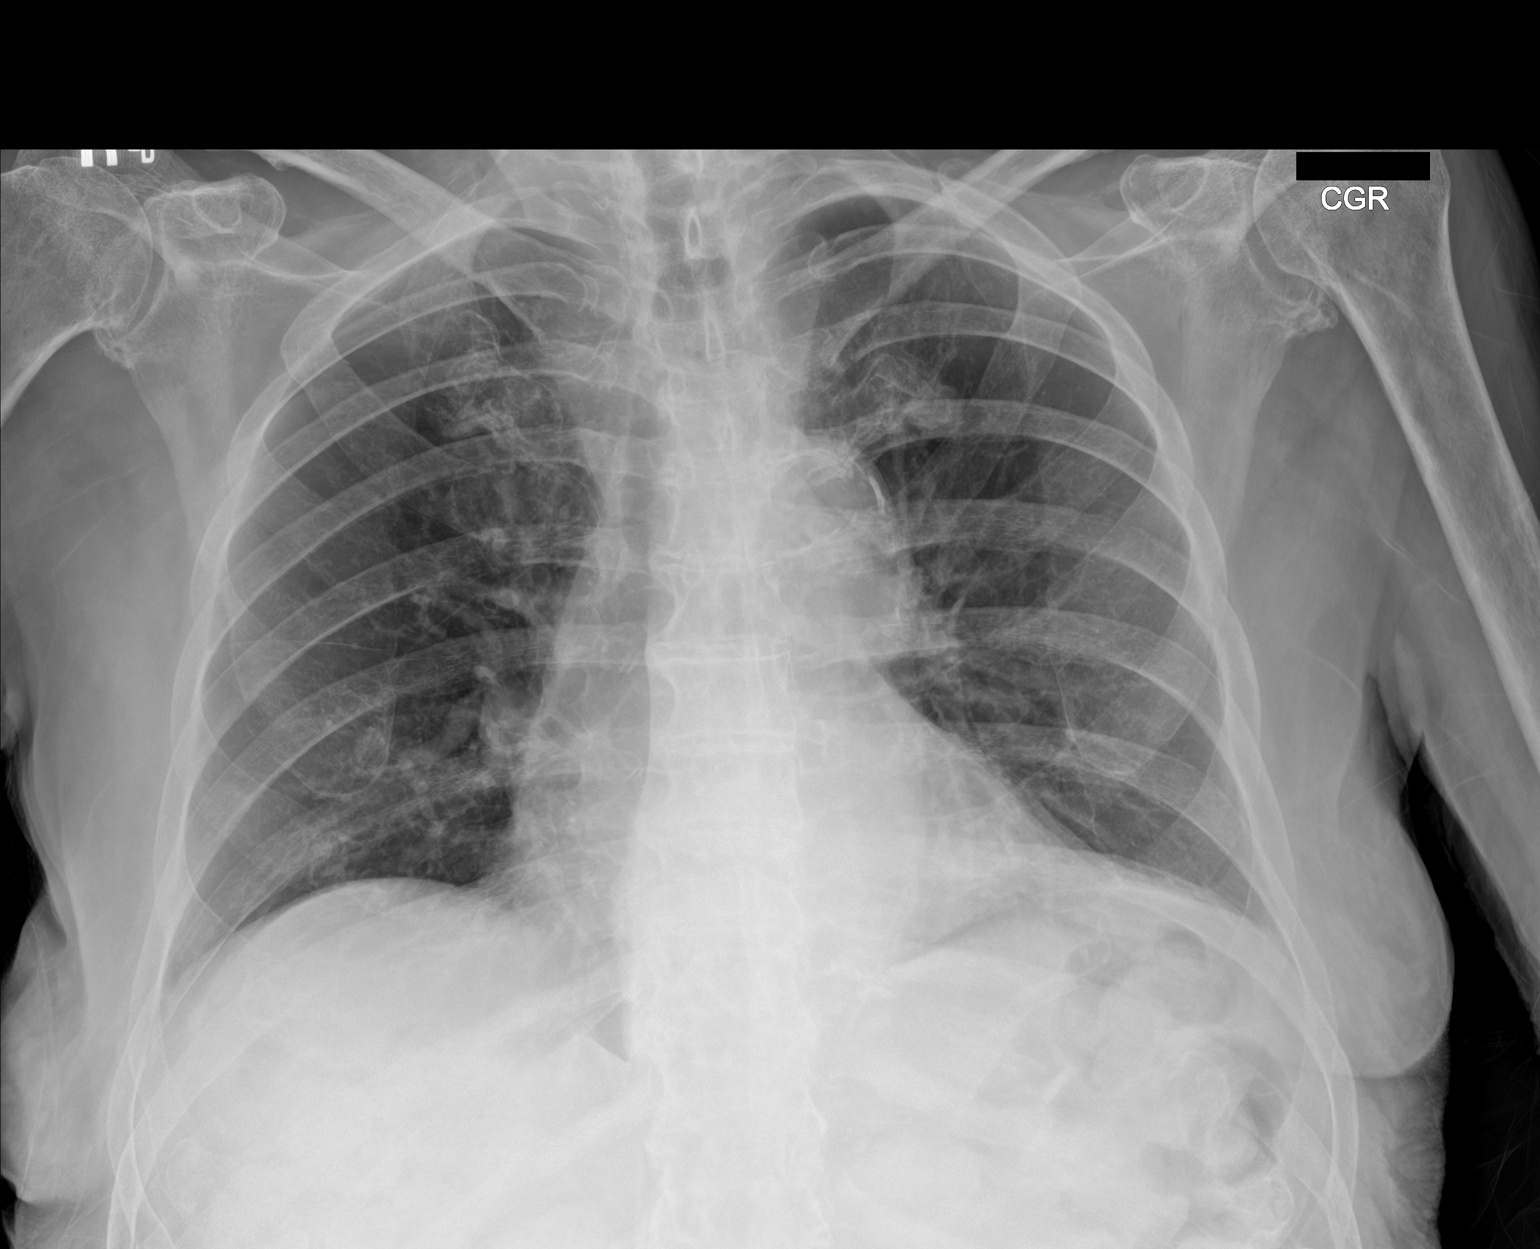

[1 of 1 positions shown; findings below may reference images not displayed]

FINDINGS: The cardiomediastinal silhouette is mildly enlarged in
contour.Atherosclerotic calcifications of the aorta. Mildly tortuous
thoracic aorta. No pleural effusion. No pneumothorax. No acute
pleuroparenchymal abnormality. Visualized abdomen is unremarkable.
Multilevel degenerative changes of the thoracic spine.
IMPRESSION: No acute cardiopulmonary abnormality.
